# Patient Record
Sex: Female | Born: 1990 | Race: White | Hispanic: No | Marital: Married | Smoking: Never smoker
Health system: Southern US, Community
[De-identification: ages and names within clinical notes are randomized; demographics above are authoritative.]

## PROBLEM LIST (undated history)

## (undated) DIAGNOSIS — T7840XA Allergy, unspecified, initial encounter: Secondary | ICD-10-CM

## (undated) DIAGNOSIS — R519 Headache, unspecified: Secondary | ICD-10-CM

## (undated) HISTORY — DX: Allergy, unspecified, initial encounter: T78.40XA

## (undated) HISTORY — PX: TONSILLECTOMY: SUR1361

---

## 2015-03-03 HISTORY — PX: OVARIAN CYST REMOVAL: SHX89

## 2017-07-03 LAB — HM PAP SMEAR: HM Pap smear: NORMAL

## 2018-11-29 ENCOUNTER — Other Ambulatory Visit: Payer: Self-pay

## 2018-11-29 ENCOUNTER — Encounter: Payer: Self-pay | Admitting: Physical Therapy

## 2018-11-29 ENCOUNTER — Ambulatory Visit: Payer: BC Managed Care – PPO | Attending: Obstetrics and Gynecology | Admitting: Physical Therapy

## 2018-11-29 DIAGNOSIS — R278 Other lack of coordination: Secondary | ICD-10-CM | POA: Diagnosis present

## 2018-11-29 DIAGNOSIS — R102 Pelvic and perineal pain: Secondary | ICD-10-CM

## 2018-11-29 DIAGNOSIS — M6281 Muscle weakness (generalized): Secondary | ICD-10-CM | POA: Diagnosis present

## 2018-11-29 NOTE — Therapy (Signed)
[place] [facility] [address] [place], [place], [address] Phone: [phone]   Fax:  [phone]  Physical Therapy Evaluation  Patient Details  Name: Doris Sutton MRN: [id number] Date of Birth: 03-23-1991 Referring Provider (PT): Heloise OchoaMcVey, Rebecca, [place]   Encounter Date: 11/29/2018  PT End of Session - 11/29/18 1507    Visit Number  1    Number of Visits  9    Date for PT Re-Evaluation  02/07/19    PT Start Time  1504    PT Stop Time  1554    PT Time Calculation (min)  50 min    Activity Tolerance  Patient tolerated treatment well    Behavior During Therapy  [facility] for tasks assessed/performed       History reviewed. No pertinent past medical history.  Past Surgical History:  Procedure Laterality Date  . OVARIAN CYST REMOVAL Left 03/2015    There were no vitals filed for this visit.  Pelvic Floor Physical Therapy Evaluation and Assessment    [facility] - 11/29/18 1510      Assessment   Medical Diagnosis  Pelvic Pain    Referring Provider (PT)  McVey, Lurena Joinerebecca, CNM    Onset Date/Surgical Date  10/30/18    Hand Dominance  Right    Next MD Visit  11/30/2018    Prior Therapy  Yes      Precautions   Precautions  None      Restrictions   Weight Bearing Restrictions  No      Balance Screen   Has the patient fallen in the past 6 months  No       SCREENING  Red Flags: None Have you had any night sweats? Unexplained weight loss? Saddle anesthesia? Unexplained changes in bowel or bladder habits?  SUBJECTIVE  Patient reports: Patient has participated in pelvic health previously and found KT useful; she states that she had increased LBP after using the KT. Patient reports that previous PT noted increased time to relax PFM after a contraction. October 2016 had ovarian cysts removed which are now controlled with birth controllable. Patient states she has the same pain now, but no pain when pregnant. Patient does not  presently have pain with intercourse, but also states that she has intercourse infrequently. Patient presently works on her feet for 8-9 hours/shift. Pain decreases with a deep squat but gets shooting pains down the leg with that position. Patient is concerned for endometriosis.   Fitness/Wellness: Patient walks with her baby.   Obstetrical History: G1P1, vaginal delivery, grade 2 tearing  Gynecological History: Ovarian cysts (2016; 2)  Urinary History: Urgency; 100% post void; 50% pre void. Every 2 hours. Occasional sensation to go back to urinate after recent void.  Gastrointestinal History: Bowel: 3-4x/day; Type 5-6 on [place] Stool Chart  Sexual activity/pain: 1 x/week   Location of pain: L side of pelvis Current pain:  0/10  Max pain:  7/10 Least pain:  0/10 Nature of pain: dull, achey; starts with shooting pain and subsides to dull and achey  Patient Goals: "to see what there is to make it better." Patient is distracted by pain.    OBJECTIVE  Posture/Observations:  Sitting: LLE crossed over right Standing: hyperextension at thoracolumbar junction, hangs on Y ligaments, R iliac crest and PSIS elevated; leg length assessed to be ~symmetrical.   Palpation/Segmental Motion/Joint Play: Generally hypermobile throughout; concordant L sided LBP with T12 R UPA; B UPA L2-4. Concordant pelvic pain with L  CPA L5. TTP over L sacrotuberous and posterior sacro-spinous ligaments.   Range of Motion/Flexibilty:  Spine: WFL Hips: WFL   Strength Testing: MMT  RLE LLE  Hip Flexion 3+ 3  Hip Extension 4 3  Hip Abduction  4 3+  Hip Adduction  4 4  Hip ER  4 4  Hip IR  4 3+  Knee Extension 4 4  Knee Flexion 4 4  Dorsiflexion  4 4  Plantarflexion (seated) 4 4   Pelvic Floor External Exam: Breath coordination: no appreciable change in length Verbal cues: appreciable lengthening; appreciable but short duration contraction Palpation: TTP of L ischiocavernosus just superior to  ischial tuberosity Cough: paradoxical   Internal Vaginal Exam: deferred on this date Strength (PERF):  Symmetry: Palpation: Prolapse:   Gait Analysis: Patient ambulates with decreased use of BLE musculature and hyperextension at spine, hips, and knees for stability. Decreased foot clearance.  Pelvic Floor Outcome Measures: NIH- CPSI (female) 20 (Pain 10; urinary 5; QOL 5)   Objective measurements completed on examination: See above findings.   ASSESSMENT Patient is a 28 year old presenting to clinic with chief complaints of L sided pelvic pain. Upon examination, patient demonstrates deficits in posture, pain, strength, coordination as evidenced by LLE 3/5 hip extension and flexion, hyperextension at thoracolumbar jxn, and decreased ability to lengthen the PFM with verbal and tactile cues. Patient's responses on NIH-CPSI outcome measures (20/43) indicate moderate functional limitations/disability/distress. Patient's progress may be limited due to sedentary lifestyle, chronicity/recurrence of pain; however, patient's motivation and curiosity is advantageous. Patient was able to achieve basic understanding diaphragmatic breath during today's evaluation and responded positively to educational interventions. Patient will benefit from continued skilled therapeutic intervention to address deficits in posture, pain, strength, coordination in order to manage pain, increase function, and improve overall QOL.  TREATMENT  Neuromuscular Re-education: Supine diaphragmatic breathing with VCs and TCs for improved coordination  Patient educated on prognosis, POC, and provided with HEP including: bladder diary, diaphragmatic breathing. Patient articulated understanding and returned demonstration. Patient will benefit from further education in order to maximize compliance and understanding for long-term therapeutic gains.      PT Long Term Goals - 11/29/18 1635      PT LONG TERM GOAL #1   Title   Patient will indicate at least a 7 point difference on the NIH-CPSI Female form to demonstrate clinically significant improvement in pain severity for improved overall QOL.    Baseline  IE: 20/43    Time  8    Period  Weeks    Status  New    Target Date  01/24/19      PT LONG TERM GOAL #2   Title  Patient will decrease worst pain as reported on NPRS by at least 2 points to demonstrate clinically significant reduction in pain in order to restore/improve function and overall QOL.    Baseline  IE: 7/10    Time  8    Period  Weeks    Status  New    Target Date  01/24/19      PT LONG TERM GOAL #3   Title  Patient will demonstrate understanding of basic self-management/down-regulation of the nervous system for persistent pain condition and stress as evidenced by diaphragmatic breathing without cueing, body scan/progressive relaxation meditation, and improved sleep hygiene in order to transition to independent management of patient's chief complaint: recurrent pelvic pain.    Baseline  IE: diaphragmatic breath taught    Time  8  Period  Weeks    Status  New    Target Date  01/24/19      PT LONG TERM GOAL #4   Title  Patient will report less than 50% incidents of urge urinary incontinence post-voiding over the course of 3 weeks while in order to demonstrate improved PFM coordination, strength, and function for improved overall QOL.    Baseline  IE: 100%    Time  8    Period  Weeks    Status  New    Target Date  01/24/19         Plan - 11/29/18 1508    Clinical Impression Statement  Patient is a 28 year old presenting to clinic with chief complaints of L sided pelvic pain. Upon examination, patient demonstrates deficits in posture, pain, strength, coordination as evidenced by LLE 3/5 hip extension and flexion, hyperextension at thoracolumbar jxn, and decreased ability to lengthen the PFM with verbal and tactile cues. Patient's responses on NIH-CPSI outcome measures (20/43) indicate  moderate functional limitations/disability/distress. Patient's progress may be limited due to sedentary lifestyle, chronicity/recurrence of pain; however, patient's motivation and curiosity is advantageous. Patient was able to achieve basic understanding diaphragmatic breath during today's evaluation and responded positively to educational interventions. Patient will benefit from continued skilled therapeutic intervention to address deficits in posture, pain, strength, coordination in order to manage pain, increase function, and improve overall QOL.    Personal Factors and Comorbidities  Age;Sex;Social Background;Behavior Pattern;Fitness;Time since onset of injury/illness/exacerbation;Past/Current Experience    Examination-Activity Limitations  Squat;Lift;Locomotion Level;Continence;Stand;Stairs;Carry;Caring for Others;Bend    Examination-Participation Restrictions  Laundry;Cleaning;Yard Work;Interpersonal Relationship;Meal Prep    Stability/Clinical Decision Making  Evolving/Moderate complexity    Clinical Decision Making  Moderate    Rehab Potential  Good    PT Frequency  1x / week    PT Duration  8 weeks    PT Treatment/Interventions  ADLs/Self Care Home Management;Aquatic Therapy;Cryotherapy;Electrical Stimulation;Moist Heat;Biofeedback;Gait training;Stair training;Functional mobility training;Therapeutic activities;Therapeutic exercise;Balance training;Patient/family education;Neuromuscular re-education;Manual techniques;Dry needling;Joint Manipulations;Spinal Manipulations;Taping;Scar mobilization    PT Next Visit Plan  Mobilizations with movement; PFM lengthening coordination    PT Home Exercise Plan  diaphragmatic breathing, bladder diary    Consulted and Agree with Plan of Care  Patient       Patient will benefit from skilled therapeutic intervention in order to improve the following deficits and impairments:  Abnormal gait, Difficulty walking, Increased muscle spasms, Improper body  mechanics, Decreased coordination, Decreased strength, Postural dysfunction, Pain, Increased fascial restricitons, Decreased balance, Decreased endurance, Decreased mobility  Visit Diagnosis: 1. Pelvic pain   2. Muscle weakness (generalized)   3. Other lack of coordination        Problem List There are no active problems to display for this patient.  Myles Gip PT, DPT [phone] 11/29/2018, 4:39 PM  [place] [facility] [address] [place], [place], [address] Phone: [phone]   Fax:  [phone]  Name: Doris Sutton MRN: [id number] Date of Birth: May 25, 1991

## 2018-12-07 ENCOUNTER — Encounter: Payer: Self-pay | Admitting: Physical Therapy

## 2018-12-07 ENCOUNTER — Ambulatory Visit: Payer: BC Managed Care – PPO | Attending: Obstetrics and Gynecology | Admitting: Physical Therapy

## 2018-12-07 ENCOUNTER — Other Ambulatory Visit: Payer: Self-pay

## 2018-12-07 DIAGNOSIS — M6281 Muscle weakness (generalized): Secondary | ICD-10-CM | POA: Insufficient documentation

## 2018-12-07 DIAGNOSIS — R102 Pelvic and perineal pain: Secondary | ICD-10-CM

## 2018-12-07 DIAGNOSIS — R278 Other lack of coordination: Secondary | ICD-10-CM | POA: Diagnosis present

## 2018-12-07 NOTE — Therapy (Signed)
[place] [facility] [address] [place], [place], [address] Phone: [phone]   Fax:  [phone]  Physical Therapy Treatment  Patient Details  Name: Doris Sutton MRN: [id number] Date of Birth: Sep 09, 1990 Referring Provider (PT): Heloise OchoaMcVey, Rebecca, [place]   Encounter Date: 12/07/2018  PT End of Session - 12/07/18 1555    Visit Number  2    Number of Visits  9    Date for PT Re-Evaluation  02/07/19    PT Start Time  1543    PT Stop Time  1636    PT Time Calculation (min)  53 min    Activity Tolerance  Patient tolerated treatment well    Behavior During Therapy  [facility] for tasks assessed/performed       History reviewed. No pertinent past medical history.  Past Surgical History:  Procedure Laterality Date  . OVARIAN CYST REMOVAL Left 03/2015    There were no vitals filed for this visit.  Subjective Assessment - 12/07/18 1545    Subjective  Patient notes that she has pain today and notes that it occasionally shoots to the inner thigh. Patient supplied bladder diary. Patient notes that she did a rotational movment at the spine and had a back spasm which was exacerabted by breathing.    Currently in Pain?  Yes    Pain Score  7     Pain Location  Pelvis    Pain Orientation  Left    Pain Descriptors / Indicators  Sharp;Shooting       TREATMENT  Neuromuscular Re-education: Supine Diaphragmatic breathing x20 PFM lengthening with anterior pelvic tilt x20 Adductor lengthening stretches in various positions: sitting, supine, standing. Patient educated on prolonged supported positioning within tolerable ranges. Education on bladder function and urge suppression techniques with handout.    Patient educated throughout session on appropriate technique and form using multi-modal cueing, HEP, and activity modification. Patient articulated understanding and returned demonstration.  Patient Response to interventions: Denies any increase in pain  intensity.  ASSESSMENT Patient presents to clinic with excellent motivation to participate in therapy. Patient demonstrates deficits in posture, pain, strength, coordination as evidenced by slumped posture and posterior pelvic tilt in sitting and standing rest postures. Patient able to achieve understanding of urge suppression during today's session and responded positively to educational interventions. Patient will benefit from continued skilled therapeutic intervention to address remaining deficits in posture, pain, strength, coordination in order to increase function and improve overall QOL.  PT Long Term Goals - 11/29/18 1635      PT LONG TERM GOAL #1   Title  Patient will indicate at least a 7 point difference on the NIH-CPSI Female form to demonstrate clinically significant improvement in pain severity for improved overall QOL.    Baseline  IE: 20/43    Time  8    Period  Weeks    Status  New    Target Date  01/24/19      PT LONG TERM GOAL #2   Title  Patient will decrease worst pain as reported on NPRS by at least 2 points to demonstrate clinically significant reduction in pain in order to restore/improve function and overall QOL.    Baseline  IE: 7/10    Time  8    Period  Weeks    Status  New    Target Date  01/24/19      PT LONG TERM GOAL #3   Title  Patient will demonstrate understanding of basic  self-management/down-regulation of the nervous system for persistent pain condition and stress as evidenced by diaphragmatic breathing without cueing, body scan/progressive relaxation meditation, and improved sleep hygiene in order to transition to independent management of patient's chief complaint: recurrent pelvic pain.    Baseline  IE: diaphragmatic breath taught    Time  8    Period  Weeks    Status  New    Target Date  01/24/19      PT LONG TERM GOAL #4   Title  Patient will report less than 50% incidents of urge urinary incontinence post-voiding over the course of 3 weeks  while in order to demonstrate improved PFM coordination, strength, and function for improved overall QOL.    Baseline  IE: 100%    Time  8    Period  Weeks    Status  New    Target Date  01/24/19            Plan - 12/08/18 1054    Clinical Impression Statement  Patient presents to clinic with excellent motivation to participate in therapy. Patient demonstrates deficits in posture, pain, strength, coordination as evidenced by slumped posture and posterior pelvic tilt in sitting and standing rest postures. Patient able to achieve understanding of urge suppression during today's session and responded positively to educational interventions. Patient will benefit from continued skilled therapeutic intervention to address remaining deficits in posture, pain, strength, coordination in order to increase function and improve overall QOL.    Personal Factors and Comorbidities  Age;Sex;Social Background;Behavior Pattern;Fitness;Time since onset of injury/illness/exacerbation;Past/Current Experience    Examination-Activity Limitations  Squat;Lift;Locomotion Level;Continence;Stand;Stairs;Carry;Caring for Others;Bend    Examination-Participation Restrictions  Laundry;Cleaning;Yard Work;Interpersonal Relationship;Meal Prep    Stability/Clinical Decision Making  Evolving/Moderate complexity    Rehab Potential  Good    PT Frequency  1x / week    PT Duration  8 weeks    PT Treatment/Interventions  ADLs/Self Care Home Management;Aquatic Therapy;Cryotherapy;Electrical Stimulation;Moist Heat;Biofeedback;Gait training;Stair training;Functional mobility training;Therapeutic activities;Therapeutic exercise;Balance training;Patient/family education;Neuromuscular re-education;Manual techniques;Dry needling;Joint Manipulations;Spinal Manipulations;Taping;Scar mobilization    PT Next Visit Plan  Mobilizations with movement; PFM lengthening coordination    PT Home Exercise Plan  diaphragmatic breathing, bladder diary     Consulted and Agree with Plan of Care  Patient       Patient will benefit from skilled therapeutic intervention in order to improve the following deficits and impairments:  Abnormal gait, Difficulty walking, Increased muscle spasms, Improper body mechanics, Decreased coordination, Decreased strength, Postural dysfunction, Pain, Increased fascial restricitons, Decreased balance, Decreased endurance, Decreased mobility  Visit Diagnosis: 1. Pelvic pain   2. Muscle weakness (generalized)   3. Other lack of coordination        Problem List There are no active problems to display for this patient.  Myles Gip PT, DPT [phone] 12/08/2018, 10:55 AM  [place] [facility] [address] [place], [place], [address] Phone: [phone]   Fax:  [phone]  Name: Doris Sutton MRN: [id number] Date of Birth: 05/12/1991

## 2018-12-14 ENCOUNTER — Ambulatory Visit: Payer: BC Managed Care – PPO | Admitting: Physical Therapy

## 2018-12-14 ENCOUNTER — Other Ambulatory Visit: Payer: Self-pay

## 2018-12-14 ENCOUNTER — Encounter: Payer: Self-pay | Admitting: Physical Therapy

## 2018-12-14 DIAGNOSIS — R102 Pelvic and perineal pain: Secondary | ICD-10-CM | POA: Diagnosis not present

## 2018-12-14 DIAGNOSIS — M6281 Muscle weakness (generalized): Secondary | ICD-10-CM

## 2018-12-14 DIAGNOSIS — R278 Other lack of coordination: Secondary | ICD-10-CM

## 2018-12-14 NOTE — Therapy (Signed)
[place] [facility] [address] [place], [place], [address] Phone: [phone]   Fax:  [phone]  Physical Therapy Treatment  Patient Details  Name: Doris Sutton MRN: [id number] Date of Birth: July 27, 1990 Referring Provider (PT): Hassan Buckler, [place]   Encounter Date: 12/14/2018  PT End of Session - 12/14/18 1557    Visit Number  3    Number of Visits  9    Date for PT Re-Evaluation  02/07/19    PT Start Time  4332    PT Stop Time  1646    PT Time Calculation (min)  54 min    Activity Tolerance  Patient tolerated treatment well    Behavior During Therapy  [facility] for tasks assessed/performed       History reviewed. No pertinent past medical history.  Past Surgical History:  Procedure Laterality Date  . OVARIAN CYST REMOVAL Left 03/2015    There were no vitals filed for this visit.  Subjective Assessment - 12/14/18 1555    Subjective  Patient states that she is sore from doing her stretches and notes that the standing ones have been the easiest ones to fit into her routine. She is excited to go on vacation next week as her work schedule has become more and more demanding.    Currently in Pain?  Yes    Pain Score  4     Pain Orientation  Left    Pain Descriptors / Indicators  Dull    Pain Type  Chronic pain      TREATMENT  Neuromuscular Re-education: Global CARs routine/assessment:  Cervical (increased scapular and GH elevation/abduction during rotation, with decreased hip control)  Scapular ([place] and thoracic compensatory mechanisms)  GH (significant thoracic, lumbar, and pelvic compensatory mechanisms)  Spine (inability to dissociate from hips with increased discomfort noted in hips)  Hips (inability to dissociate from spine) Hip CARs in standing for improved dissociation, tissue length, and decreased spasm. Patient requiring max VCs and TCs. MVC 40%. Passive stretch education for improved posture and resting tissue  length.  Patient educated throughout session on appropriate technique and form using multi-modal cueing, HEP, and activity modification. Patient articulated understanding and returned demonstration.   ASSESSMENT Patient presents to clinic with excellent motivation to participate in therapy. Patient demonstrates deficits in posture, pain, strength, coordination as evidenced by inability to move joints independently in absence of compensatory movement at other joints. Patient able to achieve understanding of level 1 controlled articular rotations for improved proprioception and body awareness during today's session and responded positively to educational interventions. Patient will benefit from continued skilled therapeutic intervention to address remaining deficits in posture, pain, strength, coordination in order to increase function and improve overall QOL.   PT Long Term Goals - 11/29/18 1635      PT LONG TERM GOAL #1   Title  Patient will indicate at least a 7 point difference on the NIH-CPSI Female form to demonstrate clinically significant improvement in pain severity for improved overall QOL.    Baseline  IE: 20/43    Time  8    Period  Weeks    Status  New    Target Date  01/24/19      PT LONG TERM GOAL #2   Title  Patient will decrease worst pain as reported on NPRS by at least 2 points to demonstrate clinically significant reduction in pain in order to restore/improve function and overall QOL.    Baseline  IE: 7/10  Time  8    Period  Weeks    Status  New    Target Date  01/24/19      PT LONG TERM GOAL #3   Title  Patient will demonstrate understanding of basic self-management/down-regulation of the nervous system for persistent pain condition and stress as evidenced by diaphragmatic breathing without cueing, body scan/progressive relaxation meditation, and improved sleep hygiene in order to transition to independent management of patient's chief complaint: recurrent pelvic  pain.    Baseline  IE: diaphragmatic breath taught    Time  8    Period  Weeks    Status  New    Target Date  01/24/19      PT LONG TERM GOAL #4   Title  Patient will report less than 50% incidents of urge urinary incontinence post-voiding over the course of 3 weeks while in order to demonstrate improved PFM coordination, strength, and function for improved overall QOL.    Baseline  IE: 100%    Time  8    Period  Weeks    Status  New    Target Date  01/24/19            Plan - 12/14/18 1653    Clinical Impression Statement  Patient presents to clinic with excellent motivation to participate in therapy. Patient demonstrates deficits in posture, pain, strength, coordination as evidenced by inability to move joints independently in absence of compensatory movement at other joints. Patient able to achieve understanding of level 1 controlled articular rotations for improved proprioception and body awareness during today's session and responded positively to educational interventions. Patient will benefit from continued skilled therapeutic intervention to address remaining deficits in posture, pain, strength, coordination in order to increase function and improve overall QOL.    Personal Factors and Comorbidities  Age;Sex;Social Background;Behavior Pattern;Fitness;Time since onset of injury/illness/exacerbation;Past/Current Experience    Examination-Activity Limitations  Squat;Lift;Locomotion Level;Continence;Stand;Stairs;Carry;Caring for Others;Bend    Examination-Participation Restrictions  Laundry;Cleaning;Yard Work;Interpersonal Relationship;Meal Prep    Stability/Clinical Decision Making  Evolving/Moderate complexity    Rehab Potential  Good    PT Frequency  1x / week    PT Duration  8 weeks    PT Treatment/Interventions  ADLs/Self Care Home Management;Aquatic Therapy;Cryotherapy;Electrical Stimulation;Moist Heat;Biofeedback;Gait training;Stair training;Functional mobility  training;Therapeutic activities;Therapeutic exercise;Balance training;Patient/family education;Neuromuscular re-education;Manual techniques;Dry needling;Joint Manipulations;Spinal Manipulations;Taping;Scar mobilization    PT Next Visit Plan  Mobilizations with movement; PFM lengthening coordination    PT Home Exercise Plan  diaphragmatic breathing, bladder diary    Consulted and Agree with Plan of Care  Patient       Patient will benefit from skilled therapeutic intervention in order to improve the following deficits and impairments:  Abnormal gait, Difficulty walking, Increased muscle spasms, Improper body mechanics, Decreased coordination, Decreased strength, Postural dysfunction, Pain, Increased fascial restricitons, Decreased balance, Decreased endurance, Decreased mobility  Visit Diagnosis: 1. Pelvic pain   2. Muscle weakness (generalized)   3. Other lack of coordination        Problem List There are no active problems to display for this patient.  Sheria LangKatlin Xareni Kelch PT, DPT [phone] 12/15/2018, 12:16 PM  [facility] [address] [place], [place], [address] Phone: [phone]   Fax:  [phone]  Name: Doris MeekerKelly Sutton MRN: [id number] Date of Birth: 31-Mar-1991

## 2018-12-21 ENCOUNTER — Ambulatory Visit: Payer: BC Managed Care – PPO | Admitting: Physical Therapy

## 2018-12-28 ENCOUNTER — Encounter: Payer: Self-pay | Admitting: Physical Therapy

## 2018-12-28 ENCOUNTER — Other Ambulatory Visit: Payer: Self-pay

## 2018-12-28 ENCOUNTER — Ambulatory Visit: Payer: BC Managed Care – PPO | Admitting: Physical Therapy

## 2018-12-28 DIAGNOSIS — R102 Pelvic and perineal pain: Secondary | ICD-10-CM | POA: Diagnosis not present

## 2018-12-28 DIAGNOSIS — R278 Other lack of coordination: Secondary | ICD-10-CM

## 2018-12-28 DIAGNOSIS — M6281 Muscle weakness (generalized): Secondary | ICD-10-CM

## 2018-12-29 NOTE — Therapy (Signed)
[place] [facility] [address] [place], [place], [address] Phone: [phone]   Fax:  [phone]  Physical Therapy Treatment  Patient Details  Name: Doris Sutton MRN: [id number] Date of Birth: [birth year] Referring Provider (PT): Hassan Buckler, [place]   Encounter Date: 12/28/2018  PT End of Session - 12/28/18 1600    Visit Number  4    Number of Visits  9    Date for PT Re-Evaluation  02/07/19    PT Start Time  1550    PT Stop Time  1630    PT Time Calculation (min)  40 min    Activity Tolerance  Patient tolerated treatment well    Behavior During Therapy  [facility] for tasks assessed/performed       History reviewed. No pertinent past medical history.  Past Surgical History:  Procedure Laterality Date  . OVARIAN CYST REMOVAL Left 03/2015    There were no vitals filed for this visit.  Subjective Assessment - 12/28/18 1554    Subjective  Patient states that she had a nice vacation. She reports that she did her exercises while she was on vacation. Patient notes that she rode jet skis and did note some increased soreness in the legs. Patient notes that this is the first day she has not had much soreness. Patient also reports starting her first period since giving birth; and has had some cramping.    Currently in Pain?  Yes    Pain Score  4     Pain Location  Pelvis       TREATMENT  Pre-treatment assessment: L hip abduction 23 degrees, R hip abduction 20 degrees, ASIS level  Manual Therapy: STM and TPR performed to R adductor and hamstring group to allow for decreased tension and pain and improved posture and function. Distal portions responding well to release; moving proximally more reactive spasm  Neuromuscular Re-education: PNF contract-relax adductor lengthening in hooklying, multiple bouts Passive range hold R hip abduction, bent knee with gentle isometrics for decreased muscle spasm Reviewed HEP.  Patient educated throughout  session on appropriate technique and form using multi-modal cueing, HEP, and activity modification. Patient articulated understanding and returned demonstration.  Patient Response to interventions: Patient reports decreased R hip stiffness  ASSESSMENT Patient presents to clinic with excellent motivation to participate in therapy. Patient demonstrates deficits in posture, pain, strength, coordination as evidenced by increased trigger points and hypertonicity in R adductor group. Patient able to achieve decreased muscle spasm in distal R adductor group during today's session and responded positively to educational interventions. Patient will benefit from continued skilled therapeutic intervention to address remaining deficits in posture, pain, strength, coordination in order to increase function and improve overall QOL.    PT Long Term Goals - 11/29/18 1635      PT LONG TERM GOAL #1   Title  Patient will indicate at least a 7 point difference on the NIH-CPSI Female form to demonstrate clinically significant improvement in pain severity for improved overall QOL.    Baseline  IE: 20/43    Time  8    Period  Weeks    Status  New    Target Date  01/24/19      PT LONG TERM GOAL #2   Title  Patient will decrease worst pain as reported on NPRS by at least 2 points to demonstrate clinically significant reduction in pain in order to restore/improve function and overall QOL.    Baseline  IE: 7/10  Time  8    Period  Weeks    Status  New    Target Date  01/24/19      PT LONG TERM GOAL #3   Title  Patient will demonstrate understanding of basic self-management/down-regulation of the nervous system for persistent pain condition and stress as evidenced by diaphragmatic breathing without cueing, body scan/progressive relaxation meditation, and improved sleep hygiene in order to transition to independent management of patient's chief complaint: recurrent pelvic pain.    Baseline  IE: diaphragmatic  breath taught    Time  8    Period  Weeks    Status  New    Target Date  01/24/19      PT LONG TERM GOAL #4   Title  Patient will report less than 50% incidents of urge urinary incontinence post-voiding over the course of 3 weeks while in order to demonstrate improved PFM coordination, strength, and function for improved overall QOL.    Baseline  IE: 100%    Time  8    Period  Weeks    Status  New    Target Date  01/24/19            Plan - 12/28/18 1601    Clinical Impression Statement  Patient presents to clinic with excellent motivation to participate in therapy. Patient demonstrates deficits in posture, pain, strength, coordination as evidenced by increased trigger points and hypertonicity in R adductor group. Patient able to achieve decreased muscle spasm in distal R adductor group during today's session and responded positively to educational interventions. Patient will benefit from continued skilled therapeutic intervention to address remaining deficits in posture, pain, strength, coordination in order to increase function and improve overall QOL.    Personal Factors and Comorbidities  Age;Sex;Social Background;Behavior Pattern;Fitness;Time since onset of injury/illness/exacerbation;Past/Current Experience    Examination-Activity Limitations  Squat;Lift;Locomotion Level;Continence;Stand;Stairs;Carry;Caring for Others;Bend    Examination-Participation Restrictions  Laundry;Cleaning;Yard Work;Interpersonal Relationship;Meal Prep    Stability/Clinical Decision Making  Evolving/Moderate complexity    Rehab Potential  Good    PT Frequency  1x / week    PT Duration  8 weeks    PT Treatment/Interventions  ADLs/Self Care Home Management;Aquatic Therapy;Cryotherapy;Electrical Stimulation;Moist Heat;Biofeedback;Gait training;Stair training;Functional mobility training;Therapeutic activities;Therapeutic exercise;Balance training;Patient/family education;Neuromuscular re-education;Manual  techniques;Dry needling;Joint Manipulations;Spinal Manipulations;Taping;Scar mobilization    PT Next Visit Plan  Mobilizations with movement; PFM lengthening coordination    PT Home Exercise Plan  diaphragmatic breathing, bladder diary    Consulted and Agree with Plan of Care  Patient       Patient will benefit from skilled therapeutic intervention in order to improve the following deficits and impairments:  Abnormal gait, Difficulty walking, Increased muscle spasms, Improper body mechanics, Decreased coordination, Decreased strength, Postural dysfunction, Pain, Increased fascial restricitons, Decreased balance, Decreased endurance, Decreased mobility  Visit Diagnosis: 1. Pelvic pain   2. Muscle weakness (generalized)   3. Other lack of coordination        Problem List There are no active problems to display for this patient.  Sheria LangKatlin Itzamar Traynor PT, DPT [phone] 12/29/2018, 8:12 AM  [place] [facility] [address] [place], [place], [address] Phone: [phone]   Fax:  [phone]  Name: Doris MeekerKelly Sutton MRN: [id number] Date of Birth: April 09, 1991

## 2019-01-04 ENCOUNTER — Other Ambulatory Visit: Payer: Self-pay

## 2019-01-04 ENCOUNTER — Ambulatory Visit: Payer: BC Managed Care – PPO | Attending: Obstetrics and Gynecology | Admitting: Physical Therapy

## 2019-01-04 ENCOUNTER — Encounter: Payer: Self-pay | Admitting: Physical Therapy

## 2019-01-04 DIAGNOSIS — M6281 Muscle weakness (generalized): Secondary | ICD-10-CM

## 2019-01-04 DIAGNOSIS — R102 Pelvic and perineal pain unspecified side: Secondary | ICD-10-CM

## 2019-01-04 DIAGNOSIS — R278 Other lack of coordination: Secondary | ICD-10-CM

## 2019-01-04 NOTE — Therapy (Signed)
[place] [facility] [address] [place], [place], [address] Phone: [phone]   Fax:  [phone]  Physical Therapy Treatment  Patient Details  Name: Doris Sutton MRN: [id number] Date of Birth: [birth year] Referring Provider (PT): Hassan Buckler, [place]   Encounter Date: 01/04/2019  PT End of Session - 01/04/19 1639    Visit Number  5    Number of Visits  9    Date for PT Re-Evaluation  02/07/19    PT Start Time  6962    PT Stop Time  1637    PT Time Calculation (min)  47 min    Activity Tolerance  Patient tolerated treatment well    Behavior During Therapy  [facility] for tasks assessed/performed       History reviewed. No pertinent past medical history.  Past Surgical History:  Procedure Laterality Date  . OVARIAN CYST REMOVAL Left 03/2015    There were no vitals filed for this visit.  Subjective Assessment - 01/04/19 1551    Subjective  Patient reports that work has been very busy with school returning. And she does not report having much time for herself to manage stress at work or at home. Patient notes that she has felt increased tension in her adductors and PFM as a result of the increased stress.       TREATMENT  Pre-treatment assessment:  Manual Therapy: STM and TPR performed to B adductor groups to allow for decreased tension and pain and improved posture and function   Neuromuscular Re-education: Patient education on sleep hygiene and stress management techniques using motivational interviewing techniques. Patient mildly resistant to changes in behaviors, but willing to consider.  Reviewed/modified current HEP to decrease stress placed on adductor muscle group to limit increased spasm.    Patient educated throughout session on appropriate technique and form using multi-modal cueing, HEP, and activity modification. Patient articulated understanding and returned demonstration.  Patient Response to interventions: Patient  reports decreased tension on L, mildly improved on R.   ASSESSMENT Patient presents to clinic with excellent motivation to participate in therapy. Patient demonstrates deficits in posture, pain, strength, coordination as evidenced by increased trigger points and hypertonicity in R adductor group. Patient able to achieve decreased muscle spasm in B distal adductor group during today's session and responded positively to educational interventions. Patient will benefit from continued skilled therapeutic intervention to address remaining deficits in posture, pain, strength, coordination in order to increase function and improve overall QOL.        PT Long Term Goals - 11/29/18 1635      PT LONG TERM GOAL #1   Title  Patient will indicate at least a 7 point difference on the NIH-CPSI Female form to demonstrate clinically significant improvement in pain severity for improved overall QOL.    Baseline  IE: 20/43    Time  8    Period  Weeks    Status  New    Target Date  01/24/19      PT LONG TERM GOAL #2   Title  Patient will decrease worst pain as reported on NPRS by at least 2 points to demonstrate clinically significant reduction in pain in order to restore/improve function and overall QOL.    Baseline  IE: 7/10    Time  8    Period  Weeks    Status  New    Target Date  01/24/19      PT LONG TERM GOAL #3  Title  Patient will demonstrate understanding of basic self-management/down-regulation of the nervous system for persistent pain condition and stress as evidenced by diaphragmatic breathing without cueing, body scan/progressive relaxation meditation, and improved sleep hygiene in order to transition to independent management of patient's chief complaint: recurrent pelvic pain.    Baseline  IE: diaphragmatic breath taught    Time  8    Period  Weeks    Status  New    Target Date  01/24/19      PT LONG TERM GOAL #4   Title  Patient will report less than 50% incidents of urge urinary  incontinence post-voiding over the course of 3 weeks while in order to demonstrate improved PFM coordination, strength, and function for improved overall QOL.    Baseline  IE: 100%    Time  8    Period  Weeks    Status  New    Target Date  01/24/19            Plan - 01/04/19 1639    Clinical Impression Statement  Patient presents to clinic with excellent motivation to participate in therapy. Patient demonstrates deficits in posture, pain, strength, coordination as evidenced by increased trigger points and hypertonicity in R adductor group. Patient able to achieve decreased muscle spasm in B distal adductor group during today's session and responded positively to educational interventions. Patient will benefit from continued skilled therapeutic intervention to address remaining deficits in posture, pain, strength, coordination in order to increase function and improve overall QOL.    Personal Factors and Comorbidities  Age;Sex;Social Background;Behavior Pattern;Fitness;Time since onset of injury/illness/exacerbation;Past/Current Experience    Examination-Activity Limitations  Squat;Lift;Locomotion Level;Continence;Stand;Stairs;Carry;Caring for Others;Bend    Examination-Participation Restrictions  Laundry;Cleaning;Yard Work;Interpersonal Relationship;Meal Prep    Stability/Clinical Decision Making  Evolving/Moderate complexity    Rehab Potential  Good    PT Frequency  1x / week    PT Duration  8 weeks    PT Treatment/Interventions  ADLs/Self Care Home Management;Aquatic Therapy;Cryotherapy;Electrical Stimulation;Moist Heat;Biofeedback;Gait training;Stair training;Functional mobility training;Therapeutic activities;Therapeutic exercise;Balance training;Patient/family education;Neuromuscular re-education;Manual techniques;Dry needling;Joint Manipulations;Spinal Manipulations;Taping;Scar mobilization    PT Next Visit Plan  Mobilizations with movement; PFM lengthening coordination    PT Home  Exercise Plan  diaphragmatic breathing, bladder diary    Consulted and Agree with Plan of Care  Patient       Patient will benefit from skilled therapeutic intervention in order to improve the following deficits and impairments:  Abnormal gait, Difficulty walking, Increased muscle spasms, Improper body mechanics, Decreased coordination, Decreased strength, Postural dysfunction, Pain, Increased fascial restricitons, Decreased balance, Decreased endurance, Decreased mobility  Visit Diagnosis: 1. Pelvic pain   2. Muscle weakness (generalized)   3. Other lack of coordination        Problem List There are no active problems to display for this patient.  Sheria LangKatlin Gia Lusher PT, DPT [phone]#[id number] 01/04/2019, 4:44 PM  [place] [facility] [address] [place], [place], [address] Phone: [phone]   Fax:  [phone]  Name: [name] MRN: [id number] Date of Birth: [birth year]

## 2019-01-11 ENCOUNTER — Ambulatory Visit: Payer: BC Managed Care – PPO | Admitting: Physical Therapy

## 2019-01-11 ENCOUNTER — Encounter: Payer: Self-pay | Admitting: Physical Therapy

## 2019-01-11 ENCOUNTER — Other Ambulatory Visit: Payer: Self-pay

## 2019-01-11 DIAGNOSIS — R102 Pelvic and perineal pain unspecified side: Secondary | ICD-10-CM

## 2019-01-11 DIAGNOSIS — R278 Other lack of coordination: Secondary | ICD-10-CM

## 2019-01-11 DIAGNOSIS — M6281 Muscle weakness (generalized): Secondary | ICD-10-CM

## 2019-01-11 NOTE — Therapy (Signed)
[facility] [address] [place], [place], [address] Phone: [phone]   Fax:  [phone]  Physical Therapy Treatment  Patient Details  Name: Doris MeekerKelly Sutton MRN: [id number] Date of Birth: 1990/07/09 Referring Provider (PT): Heloise OchoaMcVey, Rebecca, [place]   Encounter Date: 01/11/2019  PT End of Session - 01/11/19 1558    Visit Number  6    Number of Visits  9    Date for PT Re-Evaluation  02/07/19    PT Start Time  1550    PT Stop Time  1645    PT Time Calculation (min)  55 min    Activity Tolerance  Patient tolerated treatment well    Behavior During Therapy  [facility]       History reviewed. No pertinent past medical history.  Past Surgical History:  Procedure Laterality Date  . OVARIAN CYST REMOVAL Left 03/2015    There were no vitals filed for this visit.  Subjective Assessment - 01/11/19 1552    Subjective  Patient reports that she has been in a lot of pain today. Patient reports that she has had some bleeding which does not line up to her menstruation because she had just finished her cycle the day prior.    Currently in Pain?  Yes    Pain Score  7     Pain Location  Groin    Pain Orientation  Left    Pain Descriptors / Indicators  Burning;Azalee CourseSharp       [facility] [address] [place], [place], [address] Phone: [phone]   Fax:  [phone]  Physical Therapy Treatment  Patient Details  Name: Doris MeekerKelly Sutton MRN: [id number] Date of Birth: 1990/07/09 Referring Provider (PT): Heloise OchoaMcVey, Rebecca, [place]   Encounter Date: 01/11/2019  PT End of Session - 01/11/19 1558    Visit Number  6    Number of Visits  9    Date for PT Re-Evaluation  02/07/19    PT Start Time  1550    PT Stop Time  1645    PT Time Calculation (min)  55 min    Activity Tolerance  Patient tolerated treatment well    Behavior During Therapy  [facility]        History reviewed. No pertinent past medical history.  Past Surgical History:  Procedure Laterality Date  . OVARIAN CYST REMOVAL Left 03/2015    There were no vitals filed for this visit.  Subjective Assessment - 01/11/19 1552    Subjective  Patient reports that she has been in a lot of pain today. Patient reports that she has had some bleeding which does not line up to her menstruation because she had just finished her cycle the day prior.    Currently in Pain?  Yes    Pain Score  7     Pain Location  Groin    Pain Orientation  Left    Pain Descriptors / Indicators  Burning;Sharp      TREATMENT  Manual Therapy: STM and TPR performed to B adductor groups, B thoracic and lumbar parapsinals to allow for decreased tension and pain and improved posture and function CPA/UPA thoracic and lumbar spine, grade II/III for decreased tension and spasm in the region  Neuromuscular Re-education: Modified cobra/swan for improved thoracic extension, x5 with coordinated breath and  Reviewed HEP and adductor stretches; discussed pain modulation techniques.   Patient educated throughout session on appropriate  technique and form using multi-modal cueing, HEP, and activity modification. Patient articulated understanding and returned demonstration.  Patient Response to interventions: Patient reports decreased back tension and mildly decreased L adductor pain, no R adductor pain  ASSESSMENT Patient presents to clinic with excellent motivation to participate in therapy. Patient demonstrates deficits in posture, pain, strength, coordination as evidenced by increased trigger points and hypertonicity in R adductor group. Patient able to achieve decreased muscle spasm in thoracolumbar region during today's session and responded positively to manual interventions. Patient will benefit from continued skilled therapeutic intervention to address remaining deficits in posture, pain, strength, coordination in  order to increase function and improve overall QOL.    PT Long Term Goals - 11/29/18 1635      PT LONG TERM GOAL #1   Title  Patient will indicate at least a 7 point difference on the NIH-CPSI Female form to demonstrate clinically significant improvement in pain severity for improved overall QOL.    Baseline  IE: 20/43    Time  8    Period  Weeks    Status  New    Target Date  01/24/19      PT LONG TERM GOAL #2   Title  Patient will decrease worst pain as reported on NPRS by at least 2 points to demonstrate clinically significant reduction in pain in order to restore/improve function and overall QOL.    Baseline  IE: 7/10    Time  8    Period  Weeks    Status  New    Target Date  01/24/19      PT LONG TERM GOAL #3   Title  Patient will demonstrate understanding of basic self-management/down-regulation of the nervous system for persistent pain condition and stress as evidenced by diaphragmatic breathing without cueing, body scan/progressive relaxation meditation, and improved sleep hygiene in order to transition to independent management of patient's chief complaint: recurrent pelvic pain.    Baseline  IE: diaphragmatic breath taught    Time  8    Period  Weeks    Status  New    Target Date  01/24/19      PT LONG TERM GOAL #4   Title  Patient will report less than 50% incidents of urge urinary incontinence post-voiding over the course of 3 weeks while in order to demonstrate improved PFM coordination, strength, and function for improved overall QOL.    Baseline  IE: 100%    Time  8    Period  Weeks    Status  New    Target Date  01/24/19            Plan - 01/11/19 1558    Clinical Impression Statement  Patient presents to clinic with excellent motivation to participate in therapy. Patient demonstrates deficits in posture, pain, strength, coordination as evidenced by increased trigger points and hypertonicity in R adductor group. Patient able to achieve decreased muscle  spasm in thoracolumbar region during today's session and responded positively to manual interventions. Patient will benefit from continued skilled therapeutic intervention to address remaining deficits in posture, pain, strength, coordination in order to increase function and improve overall QOL.    Personal Factors and Comorbidities  Age;Sex;Social Background;Behavior Pattern;Fitness;Time since onset of injury/illness/exacerbation;Past/Current Experience    Examination-Activity Limitations  Squat;Lift;Locomotion Level;Continence;Stand;Stairs;Carry;Caring for Others;Bend    Examination-Participation Restrictions  Laundry;Cleaning;Yard Work;Interpersonal Relationship;Meal Prep    Stability/Clinical Decision Making  Evolving/Moderate complexity    Rehab Potential  Good    PT  Frequency  1x / week    PT Duration  8 weeks    PT Treatment/Interventions  ADLs/Self Care Home Management;Aquatic Therapy;Cryotherapy;Electrical Stimulation;Moist Heat;Biofeedback;Gait training;Stair training;Functional mobility training;Therapeutic activities;Therapeutic exercise;Balance training;Patient/family education;Neuromuscular re-education;Manual techniques;Dry needling;Joint Manipulations;Spinal Manipulations;Taping;Scar mobilization    PT Next Visit Plan  Mobilizations with movement; PFM lengthening coordination    PT Home Exercise Plan  diaphragmatic breathing, bladder diary    Consulted and Agree with Plan of Care  Patient       Patient will benefit from skilled therapeutic intervention in order to improve the following deficits and impairments:  Abnormal gait, Difficulty walking, Increased muscle spasms, Improper body mechanics, Decreased coordination, Decreased strength, Postural dysfunction, Pain, Increased fascial restricitons, Decreased balance, Decreased endurance, Decreased mobility  Visit Diagnosis: 1. Pelvic pain   2. Muscle weakness (generalized)   3. Other lack of coordination        Problem  List There are no active problems to display for this patient.  Myles Gip PT, DPT [phone] 01/11/2019, 5:05 PM  [facility] [address] [place], [place], [address] Phone: [phone]   Fax:  [phone]  Name: Alizey Noren MRN: [id number] Date of Birth: 04/23/1991     Problem List There are no active problems to display for this patient.   Louie Casa 01/11/2019, 5:05 PM  [facility] [address] [place], [place], [address] Phone: [phone]   Fax:  [phone]  Name: Kenisha Lynds MRN: [id number] Date of Birth: 11-Jul-1990

## 2019-01-18 ENCOUNTER — Encounter: Payer: BC Managed Care – PPO | Admitting: Physical Therapy

## 2019-01-25 ENCOUNTER — Other Ambulatory Visit: Payer: Self-pay

## 2019-01-25 ENCOUNTER — Ambulatory Visit: Payer: BC Managed Care – PPO | Admitting: Physical Therapy

## 2019-01-25 ENCOUNTER — Encounter: Payer: Self-pay | Admitting: Physical Therapy

## 2019-01-25 DIAGNOSIS — R102 Pelvic and perineal pain: Secondary | ICD-10-CM | POA: Diagnosis not present

## 2019-01-25 DIAGNOSIS — R278 Other lack of coordination: Secondary | ICD-10-CM

## 2019-01-25 DIAGNOSIS — M6281 Muscle weakness (generalized): Secondary | ICD-10-CM

## 2019-01-25 NOTE — Therapy (Signed)
[facility] [address] [place], [place], [address] Phone: [phone]   Fax:  [phone]  Physical Therapy Treatment  Patient Details  Name: Doris Sutton MRN: [id number] Date of Birth: 1990-12-02 Referring Provider (PT): Heloise Ochoa, [place]   Encounter Date: 01/25/2019  PT End of Session - 01/25/19 1559    Visit Number  7    Number of Visits  9    Date for PT Re-Evaluation  02/07/19    PT Start Time  1553    PT Stop Time  1635    PT Time Calculation (min)  42 min    Activity Tolerance  Patient tolerated treatment well    Behavior During Therapy  [facility] for tasks assessed/performed       History reviewed. No pertinent past medical history.  Past Surgical History:  Procedure Laterality Date  . OVARIAN CYST REMOVAL Left 03/2015    There were no vitals filed for this visit.  Subjective Assessment - 01/25/19 1556    Subjective  Patient states that she has worked 15 days in a row due to other employees being out with covid/covid testing.       TREATMENT  Pre-treatment assessment: L ASIS slightly superior, L popliteal angle ~110 degrees  Manual Therapy: STM and TPR performed to B adductors and hamstrings to allow for decreased tension and pain and improved posture and function Patellar mobilizations with knee flexion/extension movement for decreased spasm and improved hamstring and adductor mobility mobility, grade II/III   Neuromuscular Re-education: B Progressive/regressive angular isometric loading for improved hamstring extensibility and pelvic positioning, multiple bouts 10:10. L>R tightness, both able to achieve popliteal angle ~150 degrees by end of session Standing B hamstring stretch with breath coordinated PFM lengthening at chair for support, x2 min for improved pelvic positioning and improved posture Body mechanics education and practice for split stance lift from floor to support continued adductor lengthening  with decreased back strain and posterior pelvic tilt.   Patient educated throughout session on appropriate technique and form using multi-modal cueing, HEP, and activity modification. Patient articulated understanding and returned demonstration.  Patient Response to interventions: Denies any increased pelvic pain.  ASSESSMENT Patient presents to clinic with excellent motivation to participate in therapy. Patient demonstrates deficits in posture, pain, strength, coordination as evidenced by increased trigger points and hypertonicity in R adductor group. Patient able to achieve approximately 150 degrees of L popliteal angle (~40 degree intra-session improvement) during today's session and responded positively to neuromuscular re-education interventions. Patient's condition has the potential to improve in response to therapy. Maximum improvement is yet to be obtained. The anticipated improvement is attainable and reasonable in a generally predictable time. Patient will benefit from continued skilled therapeutic intervention to address remaining deficits in posture, pain, strength, coordination in order to increase function and improve overall QOL.       PT Long Term Goals - 01/25/19 1646      PT LONG TERM GOAL #1   Title  Patient will indicate at least a 7 point difference on the NIH-CPSI Female form to demonstrate clinically significant improvement in pain severity for improved overall QOL.    Baseline  IE: 20/43    Time  8    Period  Weeks    Status  On-going    Target Date  03/22/19      PT LONG TERM GOAL #2   Title  Patient will decrease worst pain as reported on NPRS by at least 2  points to demonstrate clinically significant reduction in pain in order to restore/improve function and overall QOL.    Baseline  IE: 7/10    Time  8    Period  Weeks    Status  On-going    Target Date  03/22/19      PT LONG TERM GOAL #3   Title  Patient will demonstrate understanding of basic  self-management/down-regulation of the nervous system for persistent pain condition and stress as evidenced by diaphragmatic breathing without cueing, body scan/progressive relaxation meditation, and improved sleep hygiene in order to transition to independent management of patient's chief complaint: recurrent pelvic pain.    Baseline  IE: diaphragmatic breath taught    Time  8    Period  Weeks    Status  On-going    Target Date  03/22/19      PT LONG TERM GOAL #4   Title  Patient will report less than 50% incidents of urge urinary incontinence post-voiding over the course of 3 weeks while in order to demonstrate improved PFM coordination, strength, and function for improved overall QOL.    Baseline  IE: 100%    Time  8    Period  Weeks    Status  On-going    Target Date  03/22/19            Plan - 01/25/19 1559    Clinical Impression Statement  Patient presents to clinic with excellent motivation to participate in therapy. Patient demonstrates deficits in posture, pain, strength, coordination as evidenced by increased trigger points and hypertonicity in R adductor group. Patient able to achieve approximately 150 degrees of L popliteal angle (~40 degree intra-session improvement) during today's session and responded positively to neuromuscular re-education interventions. Patient's condition has the potential to improve in response to therapy. Maximum improvement is yet to be obtained. The anticipated improvement is attainable and reasonable in a generally predictable time. Patient will benefit from continued skilled therapeutic intervention to address remaining deficits in posture, pain, strength, coordination in order to increase function and improve overall QOL.    Personal Factors and Comorbidities  Age;Sex;Social Background;Behavior Pattern;Fitness;Time since onset of injury/illness/exacerbation;Past/Current Experience    Examination-Activity Limitations  Squat;Lift;Locomotion  Level;Continence;Stand;Stairs;Carry;Caring for Others;Bend    Examination-Participation Restrictions  Laundry;Cleaning;Yard Work;Interpersonal Relationship;Meal Prep    Stability/Clinical Decision Making  Evolving/Moderate complexity    Rehab Potential  Good    PT Frequency  1x / week    PT Duration  8 weeks    PT Treatment/Interventions  ADLs/Self Care Home Management;Aquatic Therapy;Cryotherapy;Electrical Stimulation;Moist Heat;Biofeedback;Gait training;Stair training;Functional mobility training;Therapeutic activities;Therapeutic exercise;Balance training;Patient/family education;Neuromuscular re-education;Manual techniques;Dry needling;Joint Manipulations;Spinal Manipulations;Taping;Scar mobilization    PT Next Visit Plan  Mobilizations with movement; PFM lengthening coordination    PT Home Exercise Plan  adductor and PFM lengthening stretches, diaphragmatic breathing    Consulted and Agree with Plan of Care  Patient       Patient will benefit from skilled therapeutic intervention in order to improve the following deficits and impairments:  Abnormal gait, Difficulty walking, Increased muscle spasms, Improper body mechanics, Decreased coordination, Decreased strength, Postural dysfunction, Pain, Increased fascial restricitons, Decreased balance, Decreased endurance, Decreased mobility  Visit Diagnosis: Pelvic pain  Muscle weakness (generalized)  Other lack of coordination     Problem List There are no active problems to display for this patient.  Myles Gip PT, DPT [phone] 01/25/2019, 4:48 PM  [facility] [address] [place], [place], [address] Phone: [phone]  Fax:  [phone]  Name: Ninfa MeekerKelly Barnett MRN: [id number] Date of Birth: 11-24-1990

## 2019-03-16 ENCOUNTER — Ambulatory Visit: Payer: BC Managed Care – PPO | Attending: Obstetrics and Gynecology | Admitting: Physical Therapy

## 2019-04-22 ENCOUNTER — Ambulatory Visit
Admission: EM | Admit: 2019-04-22 | Discharge: 2019-04-22 | Disposition: A | Discharge status: Home / Self Care | Payer: BC Managed Care – PPO | Attending: Emergency Medicine | Admitting: Emergency Medicine

## 2019-04-22 ENCOUNTER — Other Ambulatory Visit: Payer: Self-pay

## 2019-04-22 DIAGNOSIS — J029 Acute pharyngitis, unspecified: Secondary | ICD-10-CM | POA: Diagnosis not present

## 2019-04-22 LAB — RAPID STREP SCREEN (MED CTR MEBANE ONLY): Streptococcus, Group A Screen (Direct): NEGATIVE

## 2019-04-22 MED ORDER — IBUPROFEN 600 MG PO TABS: 600.0000 mg | ORAL_TABLET | Freq: Four times a day (QID) | ORAL | 0 refills | Status: DC | PRN

## 2019-04-22 MED ORDER — FLUTICASONE PROPIONATE 50 MCG/ACT NA SUSP: 2.0000 | Freq: Every day | NASAL | 0 refills | Status: DC

## 2019-04-22 NOTE — Discharge Instructions (Addendum)
Your Covid test will take 24 to 48 hours to come back.  Please isolate yourself until you have the results.  In the meantime, try some Allegra, Flonase. your rapid strep was negative today, so we have sent off a throat culture.  We will contact you and call in the appropriate antibiotics if your culture comes back positive for an infection requiring antibiotic treatment.  Give [place] a working phone number.  1 gram of Tylenol and 600 mg ibuprofen together 3-4 times a day as needed for pain.  Make sure you drink plenty of extra fluids.  Some people find salt water gargles and  Traditional Medicinal's "Throat Coat" tea helpful. Take 5 mL of liquid Benadryl and 5 mL of Maalox. Mix it together, and then hold it in your mouth for as long as you can and then swallow. You may do this 4 times a day.    Here is a list of primary care providers who are taking new patients:  Dr. Otilio Miu, Dr. Adline Potter [address] [place] [address] [place] [address]  [phone]  [facility] [address] [employer] [employer], [place] [address] [phone]  [facility] [place]  [phone] [place], [place] [address]  Here are clinics/ other resources who will see you if you do not have insurance. Some have certain criteria that you must meet. Call them and find out what they are:  [facility]: [address] [place], [place] [address] Phone: [phone] Hours: First and Third Saturdays of each Month, 9 a.m. - 1 p.m.  [facility]: [address] [place], [place], [place] [address] Phone: [phone] Hours: Tuesday, 4 p.m. - 8 p.m. Thursday, 1 p.m. - 8 p.m. Wednesday, 9 a.m. - [facility] [address], [place], [place] [address] Phone: [phone] Pharmacy Phone Number: [phone] Dental Phone Number: [phone] [place] Help: [phone]  Dental  Hours: Monday - Thursday, 8 a.m. - 6 p.m.  [place] [address], [place], [place] [address] Phone: [phone] Pharmacy Phone Number: [phone] [facility] [place] Insurance Help: [phone]  [facility] [address] [place], [place] [address] Phone: [phone] Pharmacy Phone Number: [phone] [facility] Insurance Help: [phone]  [facility] [address] [place], [place] [address] Phone: [phone] [facility] Insurance Help: [phone]   [address] [place], [place] [address] Phone: [phone]  Go to www.goodrx.com to look up your medications. This will give you a list of where you can find your prescriptions at the most affordable prices. Or ask the pharmacist what the cash price is, or if they have any other discount programs available to help make your medication more affordable. This can be less expensive than what you would pay with insurance.

## 2019-04-22 NOTE — ED Triage Notes (Signed)
Patient states that she was exposed to Covid by a co-worker and went and had a covid test yesterday, it was negative. Patient states that she has continued to have a sore throat since Sunday and is having wheezing and sweating.

## 2019-04-22 NOTE — ED Provider Notes (Addendum)
HPI  SUBJECTIVE:  Patient reports sore throat starting 5 days ago.  No aggravating factors.  No alleviating factors.  She has been trying vitamin C Gummies, Zicam, Tylenol.    No fevers, but reports chills and "feeling sweaty". + Swollen neck glands   No neck stiffness  No Cough No nasal congestion, rhinorrhea + Postnasal drip No Myalgias + Headache yesterday, this has resolved. No Rash  No loss of taste or smell + shortness of breath yesterday.  This has resolved. No nausea, vomiting No diarrhea No abdominal pain     No Recent Strep exposure.  She states that a coworker's family member tested positive for Covid.  She states that she personally had a negative test yesterday. No reflux sxs + Allergy sxs-attributing sore throat to allergies  No Breathing difficulty, voice changes, sensation of throat swelling shut No Drooling No Trismus No abx in past month.   No antipyretic in past 4-6 hrs  Past medical history of frequent strep status post tonsillectomy, mono in high school, allergies.  No history of GERD, diabetes, hypertension. LMP: Now.  Denies possibility being pregnant.   PMD: None.   History reviewed. No pertinent past medical history.  Past Surgical History:  Procedure Laterality Date  . OVARIAN CYST REMOVAL Left 03/2015  . TONSILLECTOMY      History reviewed. No pertinent family history.  Social History   Tobacco Use  . Smoking status: Never Smoker  . Smokeless tobacco: Never Used  Substance Use Topics  . Alcohol use: Yes    Comment: occasionally  . Drug use: Never    No current facility-administered medications for this encounter.   Current Outpatient Medications:  .  norethindrone-ethinyl estradiol (FEMHRT 1/5) 1-5 MG-MCG TABS tablet, Take 1 tablet by mouth daily., Disp: , Rfl:  .  Prenatal Vit-Fe Fumarate-FA (PRENATAL MULTIVITAMIN) TABS tablet, Take 1 tablet by mouth daily at 12 noon., Disp: , Rfl:   No Known Allergies   ROS  As noted in  HPI.   Physical Exam  BP 112/71 (BP Location: Left Arm)   Pulse 62   Temp 98.2 F (36.8 C) (Oral)   Resp 16   Ht 5\' 7"  (1.702 m)   Wt 65.8 kg   LMP 04/16/2019   SpO2 98%   BMI 22.71 kg/m   Constitutional: Well developed, well nourished, no acute distress Eyes:  EOMI, conjunctiva normal bilaterally HENT: Normocephalic, atraumatic,mucus membranes moist. + Clear nasal congestion, erythematous, swollen turbinates + slightly erythematous oropharynx.  Tonsils surgically absent.  No petechiae on palate.  Uvula midline.  Positive postnasal drip. Respiratory: Normal inspiratory effort Cardiovascular: Normal rate, no murmurs, rubs, gallops GI: nondistended, nontender. No appreciable splenomegaly skin: No rash, skin intact Lymph: No anterior cervical LN.  No posterior cervical lymphadenopathy Musculoskeletal: no deformities Neurologic: Alert & oriented x 3, no focal neuro deficits Psychiatric: Speech and behavior appropriate.  ED Course   Medications - No data to display  Orders Placed This Encounter  Procedures  . Rapid Strep Screen ([facility] ONLY)    Standing Status:   Standing    Number of Occurrences:   1    Order Specific Question:   Patient immune status    Answer:   Normal  . Culture, group A strep    Standing Status:   Standing    Number of Occurrences:   1    Results for orders placed or performed during the hospital encounter of 04/22/19 (from the past 24 hour(s))  Rapid  Strep Screen ([facility] ONLY)     Status: None   Collection Time: 04/22/19  8:50 AM   Specimen: Oral Mucosa/Gingiva; Other  Result Value Ref Range   Streptococcus, Group A Screen (Direct) NEGATIVE NEGATIVE   No results found.  ED Clinical Impression  1. Pharyngitis, unspecified etiology      ED Assessment/Plan   Rapid strep negative. Obtaining throat culture to guide antibiotic treatment.  Covid test sent.  Discussed this with patient. We'll contact them if culture is positive,  and will call in Appropriate antibiotics. Patient home with ibuprofen, Tylenol, Benadryl/Maalox mixture, Flonase, start Allegra.. Patient to followup with PMD when necessary, will refer to local primary care resources.   Discussed labs,  MDM, plan and followup with patient. Discussed sn/sx that should prompt return to the ED. patient agrees with plan.   No orders of the defined types were placed in this encounter.    *This clinic note was created using Dragon dictation software. Therefore, there may be occasional mistakes despite careful proofreading.     Domenick Gong, MD 04/22/19 1041    Domenick Gong, MD 04/22/19 1041

## 2019-04-23 LAB — NOVEL CORONAVIRUS, NAA (HOSP ORDER, SEND-OUT TO REF LAB; TAT 18-24 HRS): SARS-CoV-2, NAA: NOT DETECTED

## 2019-04-24 LAB — CULTURE, GROUP A STREP (THRC)

## 2019-09-27 ENCOUNTER — Encounter: Payer: Self-pay | Admitting: Emergency Medicine

## 2019-09-27 ENCOUNTER — Other Ambulatory Visit: Payer: Self-pay

## 2019-09-27 DIAGNOSIS — O034 Incomplete spontaneous abortion without complication: Secondary | ICD-10-CM | POA: Insufficient documentation

## 2019-09-27 DIAGNOSIS — R102 Pelvic and perineal pain: Secondary | ICD-10-CM | POA: Insufficient documentation

## 2019-09-27 DIAGNOSIS — Z79899 Other long term (current) drug therapy: Secondary | ICD-10-CM | POA: Diagnosis not present

## 2019-09-27 DIAGNOSIS — Z3A01 Less than 8 weeks gestation of pregnancy: Secondary | ICD-10-CM | POA: Diagnosis not present

## 2019-09-27 DIAGNOSIS — Z793 Long term (current) use of hormonal contraceptives: Secondary | ICD-10-CM | POA: Diagnosis not present

## 2019-09-27 LAB — CBC WITH DIFFERENTIAL/PLATELET
Abs Immature Granulocytes: 0.04 10*3/uL (ref 0.00–0.07)
Basophils Absolute: 0 10*3/uL (ref 0.0–0.1)
Basophils Relative: 0 %
Eosinophils Absolute: 0 10*3/uL (ref 0.0–0.5)
Eosinophils Relative: 0 %
HCT: 40.4 % (ref 36.0–46.0)
Hemoglobin: 13.5 g/dL (ref 12.0–15.0)
Immature Granulocytes: 0 %
Lymphocytes Relative: 22 %
Lymphs Abs: 2.5 10*3/uL (ref 0.7–4.0)
MCH: 30.5 pg (ref 26.0–34.0)
MCHC: 33.4 g/dL (ref 30.0–36.0)
MCV: 91.2 fL (ref 80.0–100.0)
Monocytes Absolute: 0.7 10*3/uL (ref 0.1–1.0)
Monocytes Relative: 6 %
Neutro Abs: 8.1 10*3/uL — ABNORMAL HIGH (ref 1.7–7.7)
Neutrophils Relative %: 72 %
Platelets: 307 10*3/uL (ref 150–400)
RBC: 4.43 MIL/uL (ref 3.87–5.11)
RDW: 11.9 % (ref 11.5–15.5)
WBC: 11.3 10*3/uL — ABNORMAL HIGH (ref 4.0–10.5)
nRBC: 0 % (ref 0.0–0.2)

## 2019-09-27 LAB — COMPREHENSIVE METABOLIC PANEL
ALT: 13 U/L (ref 0–44)
AST: 16 U/L (ref 15–41)
Albumin: 4.6 g/dL (ref 3.5–5.0)
Alkaline Phosphatase: 45 U/L (ref 38–126)
Anion gap: 9 (ref 5–15)
BUN: 10 mg/dL (ref 6–20)
CO2: 27 mmol/L (ref 22–32)
Calcium: 9.3 mg/dL (ref 8.9–10.3)
Chloride: 104 mmol/L (ref 98–111)
Creatinine, Ser: 0.93 mg/dL (ref 0.44–1.00)
GFR calc Af Amer: >60 mL/min (ref >60)
GFR calc non Af Amer: >60 mL/min (ref >60)
Glucose, Bld: 91 mg/dL (ref 70–99)
Potassium: 3.5 mmol/L (ref 3.5–5.1)
Sodium: 140 mmol/L (ref 135–145)
Total Bilirubin: 0.7 mg/dL (ref 0.3–1.2)
Total Protein: 7.5 g/dL (ref 6.5–8.1)

## 2019-09-27 LAB — URINALYSIS, COMPLETE (UACMP) WITH MICROSCOPIC
Bilirubin Urine: NEGATIVE
Glucose, UA: NEGATIVE mg/dL
Ketones, ur: NEGATIVE mg/dL
Nitrite: NEGATIVE
Protein, ur: 30 mg/dL — AB
RBC / HPF: >50 RBC/hpf — ABNORMAL HIGH (ref 0–5)
Specific Gravity, Urine: 1.023 (ref 1.005–1.030)
WBC, UA: >50 WBC/hpf — ABNORMAL HIGH (ref 0–5)
pH: 6 (ref 5.0–8.0)

## 2019-09-27 NOTE — ED Notes (Signed)
Heloise Ochoa, midwife, called to inform that patient had recent miscarriage and seen at [facility] today with US done.  Lurena Joiner asked to be paged when seen by provider.

## 2019-09-27 NOTE — ED Triage Notes (Signed)
Pt reports a miscarriage 1 week  Ago.  Pt continues to have bleeding and abd pain with cramps.  Pt alert.

## 2019-09-28 ENCOUNTER — Emergency Department: Payer: BC Managed Care – PPO

## 2019-09-28 ENCOUNTER — Emergency Department
Admission: EM | Admit: 2019-09-28 | Discharge: 2019-09-28 | Disposition: A | Discharge status: Home / Self Care | Payer: BC Managed Care – PPO | Attending: Emergency Medicine | Admitting: Emergency Medicine

## 2019-09-28 DIAGNOSIS — R102 Pelvic and perineal pain: Secondary | ICD-10-CM

## 2019-09-28 DIAGNOSIS — O034 Incomplete spontaneous abortion without complication: Secondary | ICD-10-CM

## 2019-09-28 DIAGNOSIS — N939 Abnormal uterine and vaginal bleeding, unspecified: Secondary | ICD-10-CM

## 2019-09-28 LAB — HCG, QUANTITATIVE, PREGNANCY: hCG, Beta Chain, Quant, S: 101 m[IU]/mL — ABNORMAL HIGH (ref <5)

## 2019-09-28 MED ORDER — MORPHINE SULFATE (PF) 2 MG/ML IV SOLN
INTRAVENOUS | Status: AC
  Filled 2019-09-28: qty 1

## 2019-09-28 MED ORDER — TRAMADOL HCL 50 MG PO TABS: 50.0000 mg | ORAL_TABLET | Freq: Four times a day (QID) | ORAL | 0 refills | Status: DC | PRN

## 2019-09-28 MED ORDER — MORPHINE SULFATE (PF) 2 MG/ML IV SOLN
2.0000 mg | Freq: Once | INTRAVENOUS | Status: AC
  Administered 2019-09-28: 2 mg via INTRAVENOUS
  Filled 2019-09-28: qty 1

## 2019-09-28 MED ORDER — MISOPROSTOL 200 MCG PO TABS
400.0000 ug | ORAL_TABLET | Freq: Once | ORAL | Status: AC
  Administered 2019-09-28: 400 ug via ORAL
  Filled 2019-09-28 (×2): qty 2

## 2019-09-28 MED ORDER — HYDROCODONE-ACETAMINOPHEN 5-325 MG PO TABS
1.0000 | ORAL_TABLET | Freq: Once | ORAL | Status: AC
  Administered 2019-09-28: 1 via ORAL
  Filled 2019-09-28: qty 1

## 2019-09-28 MED ORDER — MORPHINE SULFATE (PF) 2 MG/ML IV SOLN
2.0000 mg | Freq: Once | INTRAVENOUS | Status: AC
  Administered 2019-09-28: 2 mg via INTRAVENOUS

## 2019-09-28 MED ORDER — ONDANSETRON HCL 4 MG/2ML IJ SOLN
4.0000 mg | Freq: Once | INTRAMUSCULAR | Status: AC
  Administered 2019-09-28: 04:00:00 4 mg via INTRAVENOUS
  Filled 2019-09-28: qty 2

## 2019-09-28 NOTE — ED Provider Notes (Signed)
[facility] Emergency Department Provider Note  ____________________________________________   First MD Initiated Contact with Patient 09/28/19 [phone]     (approximate)  I have reviewed the triage vital signs and the nursing notes.   HISTORY  Chief Complaint Vaginal Bleeding and Abdominal Pain    HPI Doris Sutton is a 29 y.o. female G2 P1 with miscarriage 2 weeks ago at approximately 6 weeks of gestation presents to the emergency department secondary to worsening pelvic cramping and vaginal bleeding tonight.  Patient states that her bleeding had improved over the weekend and then suddenly became worse tonight.  Patient did notify Heloise Ochoa with OB/GYN who advised the patient to come to the emergency department.  Patient states that her current pain score is 4 out of 10.  Patient states that her hCG quantitative is been decreasing consistently as well.    No past medical history on file.  There are no problems to display for this patient.   Past Surgical History:  Procedure Laterality Date  . OVARIAN CYST REMOVAL Left 03/2015  . TONSILLECTOMY      Prior to Admission medications   Medication Sig Start Date End Date Taking? Authorizing Provider  fluticasone (FLONASE) 50 MCG/ACT nasal spray Place 2 sprays into both nostrils daily. 04/22/19   Domenick Gong, MD  ibuprofen (ADVIL) 600 MG tablet Take 1 tablet (600 mg total) by mouth every 6 (six) hours as needed. 04/22/19   Domenick Gong, MD  norethindrone-ethinyl estradiol (FEMHRT 1/5) 1-5 MG-MCG TABS tablet Take 1 tablet by mouth daily.  04/22/19  [provider]    Allergies Patient has no known allergies.  No family history on file.  Social History Social History   Tobacco Use  . Smoking status: Never Smoker  . Smokeless tobacco: Never Used  Substance Use Topics  . Alcohol use: Not Currently    Comment: occasionally  . Drug use: Never    Review of  Systems Constitutional: No fever/chills Eyes: No visual changes. ENT: No sore throat. Cardiovascular: Denies chest pain. Respiratory: Denies shortness of breath. Gastrointestinal: No abdominal pain.  No nausea, no vomiting.  No diarrhea.  No constipation. Genitourinary: Negative for dysuria. Musculoskeletal: Negative for neck pain.  Negative for back pain. Integumentary: Negative for rash. Neurological: Negative for headaches, focal weakness or numbness.  ____________________________________________   PHYSICAL EXAM:  VITAL SIGNS: ED Triage Vitals  Enc Vitals Group     BP 09/27/19 2254 119/80     Pulse Rate 09/27/19 2254 (!) 102     Resp 09/27/19 2254 20     Temp 09/27/19 2254 98.4 F (36.9 C)     Temp Source 09/27/19 2254 Oral     SpO2 09/27/19 2254 97 %     Weight 09/27/19 2309 61.7 kg (136 lb)     Height 09/27/19 2309 1.702 m (5\' 7" )     Head Circumference --      Peak Flow --      Pain Score 09/27/19 2321 4     Pain Loc --      Pain Edu? --      Excl. in GC? --     Constitutional: Alert and oriented.  Eyes: Conjunctivae are normal.  Mouth/Throat: Patient is wearing a mask. Neck: No stridor.  No meningeal signs.   Cardiovascular: Normal rate, regular rhythm. Good peripheral circulation. Grossly normal heart sounds. Respiratory: Normal respiratory effort.  No retractions. Gastrointestinal: Soft and nontender. No distention.  Musculoskeletal: No lower extremity tenderness nor  edema. No gross deformities of extremities. Neurologic:  Normal speech and language. No gross focal neurologic deficits are appreciated.  Skin:  Skin is warm, dry and intact. Psychiatric: Mood and affect are normal. Speech and behavior are normal.  ____________________________________________   LABS (all labs ordered are listed, but only abnormal results are displayed)  Labs Reviewed  CBC WITH DIFFERENTIAL/PLATELET - Abnormal; Notable for the following components:      Result Value   WBC  11.3 (*)    Neutro Abs 8.1 (*)    All other components within normal limits  URINALYSIS, COMPLETE (UACMP) WITH MICROSCOPIC - Abnormal; Notable for the following components:   Color, Urine YELLOW (*)    APPearance CLOUDY (*)    Hgb urine dipstick LARGE (*)    Protein, ur 30 (*)    Leukocytes,Ua LARGE (*)    RBC / HPF >50 (*)    WBC, UA >50 (*)    Bacteria, UA RARE (*)    All other components within normal limits  HCG, QUANTITATIVE, PREGNANCY - Abnormal; Notable for the following components:   hCG, Beta Chain, Quant, S 101 (*)    All other components within normal limits  COMPREHENSIVE METABOLIC PANEL     RADIOLOGY I, Hamlin N Laprecious Austill, personally viewed and evaluated these images (plain radiographs) as part of my medical decision making, as well as reviewing the written report by the radiologist.  ED MD interpretation: Findings which could be suggestive of small area of retained products of conception on ultrasound per radiologist.  Official radiology report(s): US PELVIC COMPLETE WITH TRANSVAGINAL  Result Date: 09/28/2019 CLINICAL DATA:  Miscarriage week ago continued pelvic pain EXAM: TRANSABDOMINAL AND TRANSVAGINAL ULTRASOUND OF PELVIS TECHNIQUE: Both transabdominal and transvaginal ultrasound examinations of the pelvis were performed. Transabdominal technique was performed for global imaging of the pelvis including uterus, ovaries, adnexal regions, and pelvic cul-de-sac. It was necessary to proceed with endovaginal exam following the transabdominal exam to visualize the . COMPARISON:  None FINDINGS: Uterus Measurements: 7.1x3.7 x 4.9 cm = volume: 66 mL. No fibroids or other mass visualized. Endometrium Thickness: 6.1 mm. Heterogeneous areas of increased vascularity seen within the endometrium. Right ovary Measurements: 2.9 x 1.9 x 3.4 cm = volume: 10 mL. Normal appearance/no adnexal mass. Left ovary Measurements: 3.2 x 2.2 x 2.0 cm = volume: 7.5 mL. Normal appearance/no adnexal mass.  Other findings No abnormal free fluid. IMPRESSION: Findings which could be suggestive small area of retained products of conception. Electronically Signed   By: Jonna Clark M.D.   On: 09/28/2019 06:19    _______________________________________  Procedures   ____________________________________________   INITIAL IMPRESSION / MDM / ASSESSMENT AND PLAN / ED COURSE  As part of my medical decision making, I reviewed the following data within the electronic MEDICAL RECORD NUMBER   29 year old female with above-stated history and physical exam a differential diagnosis including but not limited to retained products of conception, miscarriage.  Ultrasound performed revealed a small area of possible retained products of conception.  Patient discussed with Gevena Barre [facility] OB[facility]GYN) who stated that she discussed the patient with Dr. Elesa Massed who recommended that the patient receive Cytotec 400 mcg which was administered in the emergency department.  Patient advised to follow-up with OB/GYN today  ____________________________________________  FINAL CLINICAL IMPRESSION(S) / ED DIAGNOSES  Final diagnoses:  Retained products of conception after miscarriage     MEDICATIONS GIVEN DURING THIS VISIT:  Medications  misoprostol (CYTOTEC) tablet 400 mcg (has no administration in time  range)  morphine 2 MG/ML injection 2 mg (2 mg Intravenous Given 09/28/19 0423)  ondansetron (ZOFRAN) injection 4 mg (4 mg Intravenous Given 09/28/19 0423)     ED Discharge Orders    None      *Please note:  Doris Sutton was evaluated in Emergency Department on 09/28/2019 for the symptoms described in the history of present illness. She was evaluated in the context of the global COVID-19 pandemic, which necessitated consideration that the patient might be at risk for infection with the SARS-CoV-2 virus that causes COVID-19. Institutional protocols and algorithms that pertain to the evaluation of patients at risk for  COVID-19 are in a state of rapid change based on information released by regulatory bodies including the CDC and federal and state organizations. These policies and algorithms were followed during the patient's care in the ED.  Some ED evaluations and interventions may be delayed as a result of limited staffing during the pandemic.*  Note:  This document was prepared using Dragon voice recognition software and may include unintentional dictation errors.   Gregor Hams, MD 09/28/19 [phone]

## 2019-09-28 NOTE — ED Notes (Signed)
Pt reports recent miscarriage w heavy bleeding x2 weeks, starting Tuesday. Pt reports passing bright red blood, followed by dark Birtha Hatler blood with golf ball size clots. Pt states the bleeding got better then today she began having lower abd cramping and tenderness with heavy bright red bleeding. Pt also reports sudden sharp back pain today. Pt took tylenol around 8:30pm and a percocet around 9:45pm with some relief. Pain currently 3/10

## 2019-09-28 NOTE — ED Notes (Signed)
Pt transported to US

## 2019-09-28 NOTE — ED Notes (Signed)
Assumed care of patient, reports ready to leave 0/10 pain after being medicated this morning. Vss. Family at bedside. Patient awaiting pharmacy derived medication (Cytotec) and discharge instructions.

## 2019-12-28 ENCOUNTER — Encounter: Payer: Self-pay | Admitting: Emergency Medicine

## 2019-12-28 ENCOUNTER — Other Ambulatory Visit: Payer: Self-pay

## 2019-12-28 ENCOUNTER — Ambulatory Visit
Admission: EM | Admit: 2019-12-28 | Discharge: 2019-12-28 | Disposition: A | Discharge status: Home / Self Care | Payer: BC Managed Care – PPO | Attending: Emergency Medicine | Admitting: Emergency Medicine

## 2019-12-28 DIAGNOSIS — J069 Acute upper respiratory infection, unspecified: Secondary | ICD-10-CM | POA: Diagnosis present

## 2019-12-28 DIAGNOSIS — Z20822 Contact with and (suspected) exposure to covid-19: Secondary | ICD-10-CM | POA: Diagnosis present

## 2019-12-28 LAB — PREGNANCY, URINE: Preg Test, Ur: NEGATIVE

## 2019-12-28 MED ORDER — FLUTICASONE PROPIONATE 50 MCG/ACT NA SUSP: 2.0000 | Freq: Every day | NASAL | 0 refills | Status: AC

## 2019-12-28 MED ORDER — IBUPROFEN 600 MG PO TABS: 600.0000 mg | ORAL_TABLET | Freq: Four times a day (QID) | ORAL | 0 refills | Status: DC | PRN

## 2019-12-28 NOTE — ED Provider Notes (Addendum)
HPI  SUBJECTIVE:  Doris Sutton is a 29 y.o. female who presents with 2 days of sore throat, sneezing, earache, nasal congestion, green rhinorrhea and postnasal drip.  She reports sinus pain and pressure and occasional cough secondary to the postnasal drip.  She was exposed to Covid 11 days ago.  States that her brother-in-law and sister who had the same exposure started having URI symptoms yesterday.  They have not yet been tested.  She denies any further exposure to Covid.  No fevers, body, headaches, loss of sense of smell or taste, upper dental pain, facial swelling.  No shortness of breath, nausea, vomiting, diarrhea.  No allergy symptoms.she has not yet gotten the Covid vaccine.  No antibiotics in the past month. No  Antipyretic in the past 4 to 6 hours.  She tried Tylenol and OTC cold medication with improvement in her symptoms.  No aggravating factors.  She is status post tonsillectomy has a history of allergies.  No history of frequent sinusitis, diabetes, hypertension.  LMP: 3 weeks ago.  She is not sure if she could be pregnant.  Is not using any birth control.  PMD: None.   History reviewed. No pertinent past medical history.  Past Surgical History:  Procedure Laterality Date  . OVARIAN CYST REMOVAL Left 03/2015  . TONSILLECTOMY      History reviewed. No pertinent family history.  Social History   Tobacco Use  . Smoking status: Never Smoker  . Smokeless tobacco: Never Used  Vaping Use  . Vaping Use: Never used  Substance Use Topics  . Alcohol use: Not Currently    Comment: occasionally  . Drug use: Never    No current facility-administered medications for this encounter.  Current Outpatient Medications:  .  fluticasone (FLONASE) 50 MCG/ACT nasal spray, Place 2 sprays into both nostrils daily., Disp: 16 g, Rfl: 0 .  ibuprofen (ADVIL) 600 MG tablet, Take 1 tablet (600 mg total) by mouth every 6 (six) hours as needed., Disp: 30 tablet, Rfl: 0  No Known  Allergies   ROS  As noted in HPI.   Physical Exam  BP (!) 123/90 (BP Location: Right Arm)   Pulse 86   Temp 99 F (37.2 C) (Oral)   Resp 18   Ht 5\' 7"  (1.702 m)   Wt 65.8 kg   LMP 12/07/2019   SpO2 100%   BMI 22.71 kg/m   Constitutional: Well developed, well nourished, no acute distress Eyes:  EOMI, conjunctiva normal bilaterally HENT: Normocephalic, atraumatic,mucus membranes moist.  TMs normal bilaterally, clear rhinorrhea.  Normal turbinates.  No maxillary, frontal sinus tenderness.  Tonsils surgically absent.  Erythematous oropharynx.  No petechiae on palate.  Uvula midline.  Positive postnasal drip.  Positive cobblestoning. Neck: No cervical lymphadenopathy Respiratory: Normal inspiratory effort, lungs clear bilaterally good air movement Cardiovascular: Normal rate regular rhythm no murmurs rubs or gallops GI: nondistended skin: No rash, skin intact Musculoskeletal: no deformities Neurologic: Alert & oriented x 3, no focal neuro deficits Psychiatric: Speech and behavior appropriate   ED Course   Medications - No data to display  Orders Placed This Encounter  Procedures  . SARS CORONAVIRUS 2 (TAT 6-24 HRS) Nasopharyngeal Nasopharyngeal Swab    Standing Status:   Standing    Number of Occurrences:   1    Order Specific Question:   Is this test for diagnosis or screening    Answer:   Diagnosis of ill patient    Order Specific Question:   Symptomatic for  COVID-19 as defined by CDC    Answer:   Yes    Order Specific Question:   Date of Symptom Onset    Answer:   12/27/2019    Order Specific Question:   Hospitalized for COVID-19    Answer:   No    Order Specific Question:   Admitted to ICU for COVID-19    Answer:   No    Order Specific Question:   Previously tested for COVID-19    Answer:   Yes    Order Specific Question:   Resident in a congregate (group) care setting    Answer:   No    Order Specific Question:   Employed in healthcare setting    Answer:    Yes    Order Specific Question:   Pregnant    Answer:   No    Order Specific Question:   Has patient completed COVID vaccination(s) (2 doses of Pfizer/Moderna 1 dose of [employer])    Answer:   No  . Pregnancy, urine    Standing Status:   Standing    Number of Occurrences:   1    No results found for this or any previous visit (from the past 24 hour(s)). No results found.  ED Clinical Impression  1. Upper respiratory tract infection, unspecified type   2. Encounter for laboratory testing for COVID-19 virus      ED Assessment/Plan  Checking urine pregnancy, patient states that she is not actively trying, but they are not using protection either.  She is not sure if she could be pregnant   urine pregnancy negative..  Covid PCR sent.  Will send home with ibuprofen/Tylenol, Mucinex or Mucinex D, saline nasal irrigation, Flonase.  Benadryl/Maalox as needed for sore throat but I suspect the sore throat is from the postnasal drip.  Primary care list for ongoing care.  Covid negative.   Discussed labs, MDM, treatment plan, and plan for follow-up with patient. Discussed sn/sx that should prompt return to the ED. patient agrees with plan.   Meds ordered this encounter  Medications  . fluticasone (FLONASE) 50 MCG/ACT nasal spray    Sig: Place 2 sprays into both nostrils daily.    Dispense:  16 g    Refill:  0  . ibuprofen (ADVIL) 600 MG tablet    Sig: Take 1 tablet (600 mg total) by mouth every 6 (six) hours as needed.    Dispense:  30 tablet    Refill:  0    *This clinic note was created using Scientist, clinical (histocompatibility and immunogenetics). Therefore, there may be occasional mistakes despite careful proofreading.   ?    Domenick Gong, MD 12/30/19 4401    Domenick Gong, MD 12/30/19 [phone]

## 2019-12-28 NOTE — Discharge Instructions (Addendum)
Your urine pregnancy was negative.  You will get your Covid test through MyChart. Take 1 gram of tylenol with the 600 mg motrin up to 3-4 times a day as needed for pain and fever. This is an effective combination. Drink extra fluids. Start taking  mucinex or Mucinex D to keep the mucus secretions thin. Use the [employer]Med sinus rinse with distilled water as often as you want to to reduce nasal congestion. Follow the directions on the box.  Flonase will help with the nasal congestion and postnasal drip.  Tablespoon of Maalox mixed with a tablespoon of children's Benadryl 3-4 times a day as needed for sore throat.  Here is a list of primary care providers who are taking new patients:  Dr. Elizabeth Sauer [address] [place] [address] [phone]  [facility] at [facility] [address] [place], [place] [address] [phone]  [facility] [place] [address] [place] [address]  [phone]  [facility] [address] [place], [place] [address] [phone]  [facility] [address]  [phone] [place], [place] [address]  Go to www.goodrx.com to look up your medications. This will give you a list of where you can find your prescriptions at the most affordable prices. Or ask the pharmacist what the cash price is, or if they have any other discount programs available to help make your medication more affordable. This can be less expensive than what you would pay with insurance.

## 2019-12-28 NOTE — ED Triage Notes (Signed)
Patient c/o cough and green nasal congestion that started yesterday.

## 2019-12-29 LAB — SARS CORONAVIRUS 2 (TAT 6-24 HRS): SARS Coronavirus 2: NEGATIVE

## 2020-04-01 DIAGNOSIS — R4586 Emotional lability: Secondary | ICD-10-CM | POA: Insufficient documentation

## 2020-08-31 ENCOUNTER — Other Ambulatory Visit: Payer: Self-pay

## 2020-08-31 ENCOUNTER — Ambulatory Visit (INDEPENDENT_AMBULATORY_CARE_PROVIDER_SITE_OTHER): Payer: BLUE CROSS/BLUE SHIELD | Admitting: Family Medicine

## 2020-08-31 ENCOUNTER — Encounter: Payer: Self-pay | Admitting: Family Medicine

## 2020-08-31 VITALS — BP 110/70 | HR 76 | Temp 97.7°F | Ht 67.0 in | Wt 143.0 lb

## 2020-08-31 DIAGNOSIS — Z7689 Persons encountering health services in other specified circumstances: Secondary | ICD-10-CM

## 2020-08-31 DIAGNOSIS — J329 Chronic sinusitis, unspecified: Secondary | ICD-10-CM

## 2020-08-31 MED ORDER — AMOXICILLIN-POT CLAVULANATE 875-125 MG PO TABS: 1.0000 | ORAL_TABLET | Freq: Two times a day (BID) | ORAL | 0 refills | Status: DC

## 2020-08-31 NOTE — Progress Notes (Signed)
Date:  08/31/2020   Name:  Doris Sutton   DOB:  August 28, 1990   MRN:  [id number]   Chief Complaint: Establish Care (Needed pcp) and Sinusitis (Started Augmentin on 08/15/20- 08/22/2020 still having congestion, green sticky production. No cough)  Patient is a 30 year old female who presents for an establish care exam. The patient reports the following problems: recurrent sinusitis. Health maintenance has been reviewed upto date  Sinusitis This is a chronic problem. The current episode started more than 1 month ago. The problem has been waxing and waning since onset. There has been no fever. Her pain is at a severity of 6/10. The pain is moderate. Associated symptoms include congestion, diaphoresis, headaches and sinus pressure. Pertinent negatives include no chills, coughing, ear pain, shortness of breath, sneezing, sore throat or swollen glands. Past treatments include antibiotics, oral decongestants and saline sprays. The treatment provided moderate relief.    Lab Results  Component Value Date   CREATININE 0.93 09/27/2019   BUN 10 09/27/2019   NA 140 09/27/2019   K 3.5 09/27/2019   CL 104 09/27/2019   CO2 27 09/27/2019   No results found for: CHOL, HDL, LDLCALC, LDLDIRECT, TRIG, CHOLHDL No results found for: TSH No results found for: HGBA1C Lab Results  Component Value Date   WBC 11.3 (H) 09/27/2019   HGB 13.5 09/27/2019   HCT 40.4 09/27/2019   MCV 91.2 09/27/2019   PLT 307 09/27/2019   Lab Results  Component Value Date   ALT 13 09/27/2019   AST 16 09/27/2019   ALKPHOS 45 09/27/2019   BILITOT 0.7 09/27/2019     Review of Systems  Constitutional: Positive for diaphoresis. Negative for chills, fatigue, fever and unexpected weight change.  HENT: Positive for congestion and sinus pressure. Negative for ear discharge, ear pain, postnasal drip, rhinorrhea, sneezing and sore throat.   Eyes: Negative for photophobia, pain, discharge, redness and itching.  Respiratory: Negative  for cough, shortness of breath, wheezing and stridor.   Gastrointestinal: Negative for abdominal pain, blood in stool, constipation, diarrhea, nausea and vomiting.  Endocrine: Negative for cold intolerance, heat intolerance, polydipsia, polyphagia and polyuria.  Genitourinary: Negative for dysuria, flank pain, frequency, hematuria, menstrual problem, pelvic pain, urgency, vaginal bleeding and vaginal discharge.  Musculoskeletal: Negative for arthralgias, back pain and myalgias.  Skin: Negative for rash.  Allergic/Immunologic: Negative for environmental allergies and food allergies.  Neurological: Positive for headaches. Negative for dizziness, weakness, light-headedness and numbness.  Hematological: Negative for adenopathy. Does not bruise/bleed easily.  Psychiatric/Behavioral: Negative for dysphoric mood. The patient is not nervous/anxious.     There are no problems to display for this patient.   No Known Allergies  Past Surgical History:  Procedure Laterality Date  . OVARIAN CYST REMOVAL Left 03/2015  . TONSILLECTOMY      Social History   Tobacco Use  . Smoking status: Never Smoker  . Smokeless tobacco: Never Used  Vaping Use  . Vaping Use: Never used  Substance Use Topics  . Alcohol use: Yes    Comment: occasionally  . Drug use: Never     Medication list has been reviewed and updated.  Current Meds  Medication Sig  . Ascorbic Acid (VITAMIN C) 100 MG tablet Take 200 mg by mouth daily.  . cetirizine (ZYRTEC) 10 MG tablet Take 10 mg by mouth daily. otc  . fluticasone (FLONASE) 50 MCG/ACT nasal spray Place 2 sprays into both nostrils daily.  Marland Kitchen ibuprofen (ADVIL) 600 MG tablet Take 1  tablet (600 mg total) by mouth every 6 (six) hours as needed.    PHQ 2/9 Scores 08/31/2020  PHQ - 2 Score 0  PHQ- 9 Score 0    GAD 7 : Generalized Anxiety Score 08/31/2020  Nervous, Anxious, on Edge 0  Control/stop worrying 1  Worry too much - different things 1  Trouble relaxing 0   Restless 0  Easily annoyed or irritable 0  Afraid - awful might happen 0  Total GAD 7 Score 2  Anxiety Difficulty Not difficult at all    BP Readings from Last 3 Encounters:  08/31/20 110/70  12/28/19 (!) 123/90  09/28/19 106/67    Physical Exam Vitals and nursing note reviewed.  Constitutional:      Appearance: She is well-developed.  HENT:     Head: Normocephalic.     Right Ear: Tympanic membrane, ear canal and external ear normal.     Left Ear: Tympanic membrane, ear canal and external ear normal.     Nose: Congestion and rhinorrhea present. No septal deviation.     Right Turbinates: Swollen. Not enlarged or pale.     Left Turbinates: Swollen. Not enlarged or pale.     Right Sinus: No maxillary sinus tenderness or frontal sinus tenderness.     Left Sinus: Maxillary sinus tenderness and frontal sinus tenderness present.     Mouth/Throat:     Pharynx: Oropharynx is clear. Uvula midline.  Eyes:     General: Lids are everted, no foreign bodies appreciated. No scleral icterus.       Left eye: No foreign body or hordeolum.     Conjunctiva/sclera: Conjunctivae normal.     Right eye: Right conjunctiva is not injected.     Left eye: Left conjunctiva is not injected.     Pupils: Pupils are equal, round, and reactive to light.  Neck:     Thyroid: No thyromegaly.     Vascular: No JVD.     Trachea: No tracheal deviation.  Cardiovascular:     Rate and Rhythm: Normal rate and regular rhythm.     Heart sounds: Normal heart sounds. No murmur heard. No friction rub. No gallop.   Pulmonary:     Effort: Pulmonary effort is normal. No respiratory distress.     Breath sounds: Normal breath sounds. No wheezing or rales.  Abdominal:     General: Bowel sounds are normal.     Palpations: Abdomen is soft. There is no mass.     Tenderness: There is no abdominal tenderness. There is no guarding or rebound.  Musculoskeletal:        General: No tenderness. Normal range of motion.      Cervical back: Normal range of motion and neck supple.  Lymphadenopathy:     Head:     Right side of head: No submental, submandibular or tonsillar adenopathy.     Left side of head: No submental, submandibular or tonsillar adenopathy.     Cervical: No cervical adenopathy.     Right cervical: No superficial, deep or posterior cervical adenopathy.    Left cervical: No superficial, deep or posterior cervical adenopathy.  Skin:    General: Skin is warm.     Findings: No rash.  Neurological:     Mental Status: She is alert and oriented to person, place, and time.     Cranial Nerves: No cranial nerve deficit.     Deep Tendon Reflexes: Reflexes normal.  Psychiatric:        Mood  and Affect: Mood is not anxious or depressed.     Wt Readings from Last 3 Encounters:  08/31/20 143 lb (64.9 kg)  12/28/19 145 lb (65.8 kg)  09/27/19 136 lb (61.7 kg)    BP 110/70   Pulse 76   Temp 97.7 F (36.5 C) (Oral)   Ht 5\' 7"  (1.702 m)   Wt 143 lb (64.9 kg)   LMP 08/14/2020 (Approximate)   SpO2 98%   BMI 22.40 kg/m   Assessment and Plan:  1. Establishing care with new doctor, encounter for Patient establishing care with new physician.  Patient chart was reviewed for previous encounters most recent labs most recent imaging as well as care everywhere.  2. Recurrent sinusitis New onset.  Recurrent.  Active at this time.  Patient continues to have purulent drainage nasally despite using Sudafed and nasal steroids.  Patient has had a 7-day course of Augmentin which decreases some of the drainage however this is returned to previous levels of discharge.  There is concerned that there may be a chronic component to this sinus infection and we will refer to ear nose and throat for evaluation in the meantime we repeated her Augmentin but for 10 days.  We have also suggested that she combine a mucolytic agent with decongestant such as Sudafed saline lavage and continue intranasal steroid.  Patient is to return  if she has any further concerns prior to evaluation with ear nose and throat Dr. 08/16/2020. - amoxicillin-clavulanate (AUGMENTIN) 875-125 MG tablet; Take 1 tablet by mouth 2 (two) times daily.  Dispense: 20 tablet; Refill: 0 - Ambulatory referral to ENT

## 2020-10-01 LAB — HM PAP SMEAR

## 2020-11-08 ENCOUNTER — Other Ambulatory Visit: Payer: Self-pay

## 2020-11-08 ENCOUNTER — Encounter: Payer: Self-pay | Admitting: Family Medicine

## 2020-11-08 ENCOUNTER — Ambulatory Visit (INDEPENDENT_AMBULATORY_CARE_PROVIDER_SITE_OTHER): Payer: BLUE CROSS/BLUE SHIELD | Admitting: Family Medicine

## 2020-11-08 VITALS — BP 120/60 | HR 72 | Ht 67.0 in | Wt 148.0 lb

## 2020-11-08 DIAGNOSIS — R42 Dizziness and giddiness: Secondary | ICD-10-CM

## 2020-11-08 MED ORDER — MECLIZINE HCL 25 MG PO TABS: 25.0000 mg | ORAL_TABLET | Freq: Three times a day (TID) | ORAL | 0 refills | Status: DC | PRN

## 2020-11-08 NOTE — Progress Notes (Signed)
Date:  11/08/2020   Name:  Doris Sutton   DOB:  [birth year]   MRN:  [id number]   Chief Complaint: motion sickness (Happens when riding in a car as a passenger, watching TV- has gotten worse in the past 6 months to a year) and Labs Only (Needs renal and lipid for biometric)  Dizziness This is a chronic problem. The current episode started more than 1 year ago (5 years/more frequent over last 6 months). The problem occurs intermittently. The problem has been gradually worsening. Associated symptoms include nausea and vertigo. Pertinent negatives include no abdominal pain, chest pain, chills, congestion, coughing, fever, headaches, myalgias, neck pain, rash, sore throat or visual change. Exacerbated by: passenger riding/rooler coaster/motion simulated video games. Treatments tried: stop activity takes 30 minutes. The treatment provided moderate relief.   Lab Results  Component Value Date   CREATININE 0.93 09/27/2019   BUN 10 09/27/2019   NA 140 09/27/2019   K 3.5 09/27/2019   CL 104 09/27/2019   CO2 27 09/27/2019   No results found for: CHOL, HDL, LDLCALC, LDLDIRECT, TRIG, CHOLHDL No results found for: TSH No results found for: HGBA1C Lab Results  Component Value Date   WBC 11.3 (H) 09/27/2019   HGB 13.5 09/27/2019   HCT 40.4 09/27/2019   MCV 91.2 09/27/2019   PLT 307 09/27/2019   Lab Results  Component Value Date   ALT 13 09/27/2019   AST 16 09/27/2019   ALKPHOS 45 09/27/2019   BILITOT 0.7 09/27/2019     Review of Systems  Constitutional:  Negative for chills and fever.  HENT:  Negative for congestion, drooling, ear discharge, ear pain and sore throat.   Respiratory:  Negative for cough, shortness of breath and wheezing.   Cardiovascular:  Negative for chest pain, palpitations and leg swelling.  Gastrointestinal:  Positive for nausea. Negative for abdominal pain, blood in stool, constipation and diarrhea.  Endocrine: Negative for polydipsia.  Genitourinary:  Negative  for dysuria, frequency, hematuria and urgency.  Musculoskeletal:  Negative for back pain, myalgias and neck pain.  Skin:  Negative for rash.  Allergic/Immunologic: Negative for environmental allergies.  Neurological:  Positive for dizziness and vertigo. Negative for headaches.  Hematological:  Does not bruise/bleed easily.  Psychiatric/Behavioral:  Negative for suicidal ideas. The patient is not nervous/anxious.    There are no problems to display for this patient.   No Known Allergies  Past Surgical History:  Procedure Laterality Date   OVARIAN CYST REMOVAL Left 03/2015   TONSILLECTOMY      Social History   Tobacco Use   Smoking status: Never   Smokeless tobacco: Never  Vaping Use   Vaping Use: Never used  Substance Use Topics   Alcohol use: Yes    Comment: occasionally   Drug use: Never     Medication list has been reviewed and updated.  Current Meds  Medication Sig   Ascorbic Acid (VITAMIN C) 100 MG tablet Take 200 mg by mouth daily.   cetirizine (ZYRTEC) 10 MG tablet Take 10 mg by mouth daily. otc   fluticasone (FLONASE) 50 MCG/ACT nasal spray Place 2 sprays into both nostrils daily.   ibuprofen (ADVIL) 600 MG tablet Take 1 tablet (600 mg total) by mouth every 6 (six) hours as needed.    PHQ 2/9 Scores 11/08/2020 08/31/2020  PHQ - 2 Score 0 0  PHQ- 9 Score 0 0    GAD 7 : Generalized Anxiety Score 11/08/2020 08/31/2020  Nervous, Anxious, on Edge  0 0  Control/stop worrying 0 1  Worry too much - different things 0 1  Trouble relaxing 0 0  Restless 0 0  Easily annoyed or irritable 0 0  Afraid - awful might happen 0 0  Total GAD 7 Score 0 2  Anxiety Difficulty - Not difficult at all    BP Readings from Last 3 Encounters:  11/08/20 120/60  08/31/20 110/70  12/28/19 (!) 123/90    Physical Exam Vitals and nursing note reviewed.  Constitutional:      General: She is not in acute distress.    Appearance: She is not diaphoretic.  HENT:     Head: Normocephalic  and atraumatic.     Right Ear: Tympanic membrane, ear canal and external ear normal.     Left Ear: Tympanic membrane, ear canal and external ear normal.     Nose: Nose normal.  Eyes:     General: No visual field deficit.       Right eye: No discharge.        Left eye: No discharge.     Conjunctiva/sclera: Conjunctivae normal.     Pupils: Pupils are equal, round, and reactive to light.  Neck:     Thyroid: No thyromegaly.     Vascular: No JVD.  Cardiovascular:     Rate and Rhythm: Normal rate and regular rhythm.     Heart sounds: Normal heart sounds. No murmur heard.   No friction rub. No gallop.  Pulmonary:     Effort: Pulmonary effort is normal.     Breath sounds: Normal breath sounds.  Abdominal:     General: Bowel sounds are normal.     Palpations: Abdomen is soft. There is no mass.     Tenderness: There is no abdominal tenderness. There is no guarding.  Musculoskeletal:        General: Normal range of motion.     Cervical back: Normal range of motion and neck supple.  Lymphadenopathy:     Cervical: No cervical adenopathy.  Skin:    General: Skin is warm and dry.  Neurological:     Mental Status: She is alert.     Cranial Nerves: Cranial nerves are intact. No cranial nerve deficit or facial asymmetry.     Sensory: Sensation is intact. No sensory deficit.     Motor: Motor function is intact.     Deep Tendon Reflexes: Reflexes are normal and symmetric.    Wt Readings from Last 3 Encounters:  11/08/20 148 lb (67.1 kg)  08/31/20 143 lb (64.9 kg)  12/28/19 145 lb (65.8 kg)    BP 120/60   Pulse 72   Ht 5\' 7"  (1.702 m)   Wt 148 lb (67.1 kg)   LMP 10/31/2020 (Approximate)   BMI 23.18 kg/m   Assessment and Plan:  1. Vertigo Chronic.  Episodic.  Stable.  Complicated.  Patient has always had an issue with vertigo and motion sickness but most recently it has increased in frequency not necessarily in intensity.  Under no circumstances can she be a passenger without  vertigo and nausea.  We will initiate Antivert 25 mg both in a treatment noted as well as may be a prophylactic when she is taking a trip med.  We will also give information on Epley maneuver that she may try on her own.  Patient has seen Dr. 12/31/2020 in the past for chronic sinusitis and we will refer back to him for evaluation of this persistent aggravation. - meclizine (  ANTIVERT) 25 MG tablet; Take 1 tablet (25 mg total) by mouth 3 (three) times daily as needed for dizziness.  Dispense: 30 tablet; Refill: 0 - Ambulatory referral to ENT

## 2020-11-08 NOTE — Patient Instructions (Addendum)
Vertigo Vertigo is the feeling that you or your surroundings are moving when they are not. This feeling can come and go at any time. Vertigo often goes away on its own. Vertigo can be dangerous if it occurs while you are doing something that could endanger you or others, such as driving or operating machinery. Your health care provider will do tests to determine the cause of your vertigo. Tests will also help your health care provider decide how best to treat your condition. Follow these instructions at home: Eating and drinking Drink enough fluid to keep your urine pale yellow. Do not drink alcohol.      Activity Return to your normal activities as told by your health care provider. Ask your health care provider what activities are safe for you. In the morning, first sit up on the side of the bed. When you feel okay, stand slowly while you hold onto something until you know that your balance is fine. Move slowly. Avoid sudden body or head movements or certain positions, as told by your health care provider. If you have trouble walking or keeping your balance, try using a cane for stability. If you feel dizzy or unstable, sit down right away. Avoid doing any tasks that would cause danger to you or others if vertigo occurs. Avoid bending down if you feel dizzy. Place items in your home so that they are easy for you to reach without leaning over. Do not drive or use heavy machinery if you feel dizzy. General instructions Take over-the-counter and prescription medicines only as told by your health care provider. Keep all follow-up visits as told by your health care provider. This is important. Contact a health care provider if: Your medicines do not relieve your vertigo or they make it worse. You have a fever. Your condition gets worse or you develop new symptoms. Your family or friends notice any behavioral changes. Your nausea or vomiting gets worse. You have numbness or a prickling and  tingling sensation in part of your body. Get help right away if you: Have difficulty moving or speaking. Are always dizzy. Faint. Develop severe headaches. Have weakness in your hands, arms, or legs. Have changes in your hearing or vision. Develop a stiff neck. Develop sensitivity to light. Summary Vertigo is the feeling that you or your surroundings are moving when they are not. Your health care provider will do tests to determine the cause of your vertigo. Follow instructions for home care. You may be told to avoid certain tasks, positions, or movements. Contact a health care provider if your medicines do not relieve your symptoms, or if you have a fever, nausea, vomiting, or changes in behavior. Get help right away if you have severe headaches or difficulty speaking, or you develop hearing or vision problems. This information is not intended to replace advice given to you by your health care provider. Make sure you discuss any questions you have with your health care provider. Document Revised: 04/12/2018 Document Reviewed: 04/12/2018 Elsevier Patient Education  2021 [employer] Benign Positional Vertigo Vertigo is the feeling that you or your surroundings are moving when they are not. Benign positional vertigo is the most common form of vertigo. This is usually a harmless condition (benign). This condition is positional. This means that symptoms are triggered by certain movements and positions. This condition can be dangerous if it occurs while you are doing something that could cause harm to you or others. This includes activities such as driving or operating  machinery. What are the causes? The inner ear has fluid-filled canals that help your brain sense movement and balance. When the fluid moves, the brain receives messages about your body's position. With benign positional vertigo, crystals in the inner ear break free and disturb the inner ear area. This causes your brain to receive  confusing messages about your body's position. What increases the risk? You are more likely to develop this condition if: You are a woman. You are 13 years of age or older. You have recently had a head injury. You have an inner ear disease. What are the signs or symptoms? Symptoms of this condition usually happen when you move your head or your eyes in different directions. Symptoms may start suddenly, and usually last for less than a minute. They include: Loss of balance and falling. Feeling like you are spinning or moving. Feeling like your surroundings are spinning or moving. Nausea and vomiting. Blurred vision. Dizziness. Involuntary eye movement (nystagmus). Symptoms can be mild and cause only minor problems, or they can be severe and interfere with daily life. Episodes of benign positional vertigo may return (recur) over time. Symptoms may improve over time. How is this diagnosed? This condition may be diagnosed based on: Your medical history. Physical exam of the head, neck, and ears. Positional tests to check for or stimulate vertigo. You may be asked to turn your head and change positions, such as going from sitting to lying down. A health care provider will watch for symptoms of vertigo. You may be referred to a health care provider who specializes in ear, nose, and throat problems (ENT, or otolaryngologist) or a provider who specializes in disorders of the nervous system (neurologist). How is this treated? This condition may be treated in a session in which your health care provider moves your head in specific positions to help the displaced crystals in your inner ear move. Treatment for this condition may take several sessions. Surgery may be needed in severe cases, but this is rare. In some cases, benign positional vertigo may resolve on its own in 2-4 weeks.   Follow these instructions at home: Safety Move slowly. Avoid sudden body or head movements or certain positions, as  told by your health care provider. Avoid driving until your health care provider says it is safe for you to do so. Avoid operating heavy machinery until your health care provider says it is safe for you to do so. Avoid doing any tasks that would be dangerous to you or others if vertigo occurs. If you have trouble walking or keeping your balance, try using a cane for stability. If you feel dizzy or unstable, sit down right away. Return to your normal activities as told by your health care provider. Ask your health care provider what activities are safe for you. General instructions Take over-the-counter and prescription medicines only as told by your health care provider. Drink enough fluid to keep your urine pale yellow. Keep all follow-up visits as told by your health care provider. This is important. Contact a health care provider if: You have a fever. Your condition gets worse or you develop new symptoms. Your family or friends notice any behavioral changes. You have nausea or vomiting that gets worse. You have numbness or a prickling and tingling sensation. Get help right away if you: Have difficulty speaking or moving. Are always dizzy. Faint. Develop severe headaches. Have weakness in your legs or arms. Have changes in your hearing or vision. Develop a stiff neck.  Develop sensitivity to light. Summary Vertigo is the feeling that you or your surroundings are moving when they are not. Benign positional vertigo is the most common form of vertigo. This condition is caused by crystals in the inner ear that become displaced. This causes a disturbance in an area of the inner ear that helps your brain sense movement and balance. Symptoms include loss of balance and falling, feeling that you or your surroundings are moving, nausea and vomiting, and blurred vision. This condition can be diagnosed based on symptoms, a physical exam, and positional tests. Follow safety instructions as told by  your health care provider. You will also be told when to contact your health care provider in case of problems. This information is not intended to replace advice given to you by your health care provider. Make sure you discuss any questions you have with your health care provider. Document Revised: 04/12/2019 Document Reviewed: 10/28/2017 Elsevier Patient Education  2021 Elsevier Inc. How to Perform the Epley Maneuver The Epley maneuver is an exercise that relieves symptoms of vertigo. Vertigo is the feeling that you or your surroundings are moving when they are not. When you feel vertigo, you may feel like the room is spinning and may have trouble walking. The Epley maneuver is used for a type of vertigo caused by a calcium deposit in a part of the inner ear. The maneuver involves changing head positions to help the deposit move out of the area. You can do this maneuver at home whenever you have symptoms of vertigo. You can repeat it in 24 hours if your vertigo has not gone away. Even though the Epley maneuver may relieve your vertigo for a few weeks, it is possible that your symptoms will return. This maneuver relieves vertigo, but it does not relieve dizziness. What are the risks? If it is done correctly, the Epley maneuver is considered safe. Sometimes it can lead to dizziness or nausea that goes away after a short time. If you develop other symptoms--such as changes in vision, weakness, or numbness--stop doing the maneuver and call your health care provider. Supplies needed: A bed or table. A pillow. How to do the Epley maneuver Sit on the edge of a bed or table with your back straight and your legs extended or hanging over the edge of the bed or table. Turn your head halfway toward the affected ear or side as told by your health care provider. Lie backward quickly with your head turned until you are lying flat on your back. You may want to position a pillow under your shoulders. Hold this  position for at least 30 seconds. If you feel dizzy or have symptoms of vertigo, continue to hold the position until the symptoms stop. Turn your head to the opposite direction until your unaffected ear is facing the floor. Hold this position for at least 30 seconds. If you feel dizzy or have symptoms of vertigo, continue to hold the position until the symptoms stop. Turn your whole body to the same side as your head so that you are positioned on your side. Your head will now be nearly facedown. Hold for at least 30 seconds. If you feel dizzy or have symptoms of vertigo, continue to hold the position until the symptoms stop. Sit back up. You can repeat the maneuver in 24 hours if your vertigo does not go away.      Follow these instructions at home: For 24 hours after doing the Epley maneuver: Keep your  head in an upright position. When lying down to sleep or rest, keep your head raised (elevated) with two or more pillows. Avoid excessive neck movements. Activity Do not drive or use machinery if you feel dizzy. After doing the Epley maneuver, return to your normal activities as told by your health care provider. Ask your health care provider what activities are safe for you. General instructions Drink enough fluid to keep your urine pale yellow. Do not drink alcohol. Take over-the-counter and prescription medicines only as told by your health care provider. Keep all follow-up visits as told by your health care provider. This is important. Preventing vertigo symptoms Ask your health care provider if there is anything you should do at home to prevent vertigo. He or she may recommend that you: Keep your head elevated with two or more pillows while you sleep. Do not sleep on the side of your affected ear. Get up slowly from bed. Avoid sudden movements during the day. Avoid extreme head positions or movement, such as looking up or bending over. Contact a health care provider if: Your vertigo  gets worse. You have other symptoms, including: Nausea. Vomiting. Headache. Get help right away if you: Have vision changes. Have a headache or neck pain that is severe or getting worse. Cannot stop vomiting. Have new numbness or weakness in any part of your body. Summary Vertigo is the feeling that you or your surroundings are moving when they are not. The Epley maneuver is an exercise that relieves symptoms of vertigo. If the Epley maneuver is done correctly, it is considered safe and relieves vertigo quickly. This information is not intended to replace advice given to you by your health care provider. Make sure you discuss any questions you have with your health care provider. Document Revised: 03/16/2019 Document Reviewed: 03/16/2019 Elsevier Patient Education  2021 [employer].

## 2021-01-10 ENCOUNTER — Ambulatory Visit
Admission: EM | Admit: 2021-01-10 | Discharge: 2021-01-10 | Disposition: A | Discharge status: Home / Self Care | Payer: BLUE CROSS/BLUE SHIELD | Attending: Emergency Medicine | Admitting: Emergency Medicine

## 2021-01-10 ENCOUNTER — Other Ambulatory Visit: Payer: Self-pay

## 2021-01-10 ENCOUNTER — Encounter: Payer: Self-pay | Admitting: Emergency Medicine

## 2021-01-10 DIAGNOSIS — B349 Viral infection, unspecified: Secondary | ICD-10-CM

## 2021-01-10 NOTE — Discharge Instructions (Addendum)
We will call you with any positive results from your COVID-19/Influenza testing completed in clinic today.  If you do not receive a phone call from us within the next 2-3 days, check your MyChart for up-to-date health information related to testing completed in clinic today.   For most people this is a self-limiting process and can take anywhere from 7 - 10 days to start feeling better. A cough can last up to 3 weeks. Pay special attention to handwashing as this can help prevent the spread of the virus.   Always read the labels of cough and cold medications as they may contain some of the ingredients below.  Rest, push lots of fluids (especially water), and utilize supportive care for symptoms. You may take acetaminophen (Tylenol) every 4-6 hours and ibuprofen every 6-8 hours for muscle pain, joint pain, headaches (you may also alternate these medications). Mucinex (guaifenesin) may be taken over the counter for cough as needed can loosen phlegm. Please read the instructions and take as directed.  Sudafed (pseudophedrine) is sold behind the counter and can help reduce nasal pressure; avoid taking this if you have high blood pressure or feel jittery. Sudafed PE (phenylephrine) can be a helpful, short-term, over-the-counter alternative to limit side effects or if you have high blood pressure.  Flonase nasal spray can help alleviate congestion and sinus pressure. Many patients choose Afrin as a nasal decongestant; do not use for more than 3 days for risk of rebound (increased symptoms after stopping medication).  Saline nasal sprays or rinses can also help nasal congestion (use bottled or sterile water). Warm tea with lemon and honey can sooth sore throat and cough, as can cough drops.   Return to clinic for high fever not improving with medications, chest pain, difficulty breathing, non-stop vomiting, or coughing blood. Follow-up with your primary care provider if symptoms do not improve as expected in  the next 5-7 days.  

## 2021-01-10 NOTE — ED Triage Notes (Signed)
Pt presents today with c/o of sore throat, chills and nasal congest "post nasal drip" x 2 days. Home Covid test taken yesterday; neg.

## 2021-01-10 NOTE — ED Provider Notes (Signed)
CHIEF COMPLAINT:   Chief Complaint  Patient presents with   Chills   Sore Throat   Nasal Congestion     SUBJECTIVE/HPI:   Sore Throat  A very pleasant 30 y.o.Female presents today with chills, sore throat, nasal congestion and postnasal drip for the last 2 days.  Patient reports on Tuesday she began having a sore throat.  Patient reports intermittent episodes of sweating.  She reports hot and cold sweats.  She states that she took Tylenol on Wednesday and states that she took a warm shower which did not help.  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  Patient states that she has not had COVID-19.  Patient reports negative COVID-19 test at home.  She reports no additional over-the-counter medications used.  She also does not report any use of antihistamines. Patient does not report any shortness of breath, chest pain, palpitations, visual changes, weakness, tingling, headache, nausea, vomiting, diarrhea.   has a past medical history of Allergy.  ROS:  Review of Systems See Subjective/HPI Medications, Allergies and Problem List personally reviewed in Epic today OBJECTIVE:   Vitals:   01/10/21 1040  BP: 115/75  Pulse: 85  Resp: 16  Temp: 98.1 F (36.7 C)  SpO2: 97%    Physical Exam   General: Appears well-developed and well-nourished. No acute distress.  HEENT Head: Normocephalic and atraumatic.   Ears: Hearing grossly intact, no drainage or visible deformity.  Nose: No nasal deviation.   Mouth/Throat: No stridor or tracheal deviation.  Non erythematous posterior pharynx noted with clear drainage present.  No white patchy exudate noted. Eyes: Conjunctivae and EOM are normal. No eye drainage or scleral icterus bilaterally.  Neck: Normal range of motion, neck is supple. No cervical, tonsillar or submandibular lymph nodes palpated.   Cardiovascular: Normal rate. Regular rhythm; no murmurs, gallops, or rubs.   Pulm/Chest: No respiratory distress. Breath sounds normal bilaterally without wheezes, rhonchi, or rales.  Neurological: Alert and oriented to person, place, and time.  Skin: Skin is warm and dry.  No rashes, lesions, abrasions or bruising noted to skin.   Psychiatric: Normal mood, affect, behavior, and thought content.   Vital signs and nursing note reviewed.   Patient stable and cooperative with examination. PROCEDURES:    LABS/X-RAYS/EKG/MEDS:   No results found for any visits on 01/10/21.  MEDICAL DECISION MAKING:   Patient presents with chills, sore throat, nasal congestion and postnasal drip for the last 2 days.  Patient reports on Tuesday she began having a sore throat.  Patient reports intermittent episodes of sweating.  She reports hot and cold sweats.  She states that she took Tylenol on Wednesday and states that she took a warm shower which did not help.  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  Patient states that she has not had COVID-19.  Patient reports negative COVID-19 test at home.  She reports no additional over-the-counter medications used.  She also does not report any use of antihistamines. Patient does not report any shortness of breath, chest pain, palpitations, visual changes, weakness, tingling, headache, nausea, vomiting, diarrhea.  Chart review completed.  COVID-19 PCR/influenza obtained in clinic today.  Work note provided.  Given symptoms along with assessment findings, likely viral illness.  Advised about home treatment and care as outlined in her AVS to include rest, Tylenol versus ibuprofen, Mucinex.  Advised that  we would call with any positive results and negative results would upload directly to her MyChart account.  Explained strict return precautions for worsening of symptoms to include uncontrolled fever, dizziness, chest pain, shortness of breath.  Return as needed.  Patient verbalized  understanding and agreed with treatment plan.  Patient stable upon discharge. ASSESSMENT/PLAN:  1. Viral illness - Covid-19, Flu A+B ([facility]); Standing - Covid-19, Flu A+B ([facility])   Plan:   Discharge Instructions      We will call you with any positive results from your COVID-19/Influenza testing completed in clinic today.  If you do not receive a phone call from [place] within the next 2-3 days, check your MyChart for up-to-date health information related to testing completed in clinic today.  For most people this is a self-limiting process and can take anywhere from 7 - 10 days to start feeling better. A cough can last up to 3 weeks. Pay special attention to handwashing as this can help prevent the spread of the virus.   Always read the labels of cough and cold medications as they may contain some of the ingredients below.  Rest, push lots of fluids (especially water), and utilize supportive care for symptoms. You may take acetaminophen (Tylenol) every 4-6 hours and ibuprofen every 6-8 hours for muscle pain, joint pain, headaches (you may also alternate these medications). Mucinex (guaifenesin) may be taken over the counter for cough as needed can loosen phlegm. Please read the instructions and take as directed.  Sudafed (pseudophedrine) is sold behind the counter and can help reduce nasal pressure; avoid taking this if you have high blood pressure or feel jittery. Sudafed PE (phenylephrine) can be a helpful, short-term, over-the-counter alternative to limit side effects or if you have high blood pressure.  Flonase nasal spray can help alleviate congestion and sinus pressure. Many patients choose Afrin as a nasal decongestant; do not use for more than 3 days for risk of rebound (increased symptoms after stopping medication).  Saline nasal sprays or rinses can also help nasal congestion (use bottled or sterile water). Warm tea with lemon and honey can sooth sore throat and cough, as can  cough drops.   Return to clinic for high fever not improving with medications, chest pain, difficulty breathing, non-stop vomiting, or coughing blood. Follow-up with your primary care provider if symptoms do not improve as expected in the next 5-7 days.          Amalia Greenhouse, FNP 01/10/21 1140

## 2021-01-12 ENCOUNTER — Encounter: Payer: Self-pay | Admitting: Family Medicine

## 2021-01-12 LAB — COVID-19, FLU A+B NAA
Influenza A, NAA: NOT DETECTED
Influenza B, NAA: NOT DETECTED
SARS-CoV-2, NAA: NOT DETECTED

## 2021-01-14 ENCOUNTER — Telehealth: Payer: Self-pay | Admitting: Emergency Medicine

## 2021-01-14 DIAGNOSIS — R059 Cough, unspecified: Secondary | ICD-10-CM

## 2021-01-14 MED ORDER — PROMETHAZINE-DM 6.25-15 MG/5ML PO SYRP: 5.0000 mL | ORAL_SOLUTION | Freq: Four times a day (QID) | ORAL | 0 refills | Status: AC | PRN

## 2021-01-14 NOTE — Telephone Encounter (Signed)
Patient reports continued coughing after testing negative for COVID-19.  Patient is requesting cough syrup.  Rx'd Phenergan cough syrup to the patient's preferred pharmacy.  Did advise that if she has any continued symptoms or concerns that she will have to be reevaluated.  Patient verbalized understanding and agreed with plan.

## 2021-04-25 IMAGING — US US PELVIS COMPLETE WITH TRANSVAGINAL
1 series · 14 of 25 positions shown · non-contrast
Comparison: None

CLINICAL DATA: Miscarriage week ago continued pelvic pain



[Series 1: us pelvic complete with transvaginal · 14 of 96 slices shown]
[im 1/96]
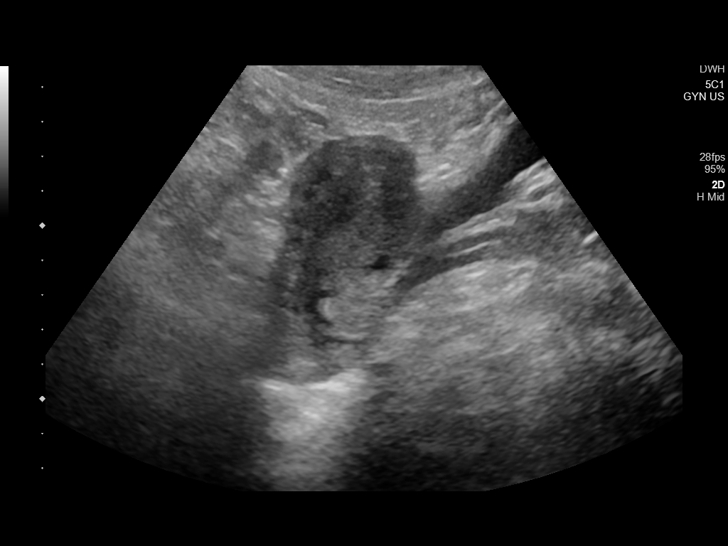
[im 8/96]
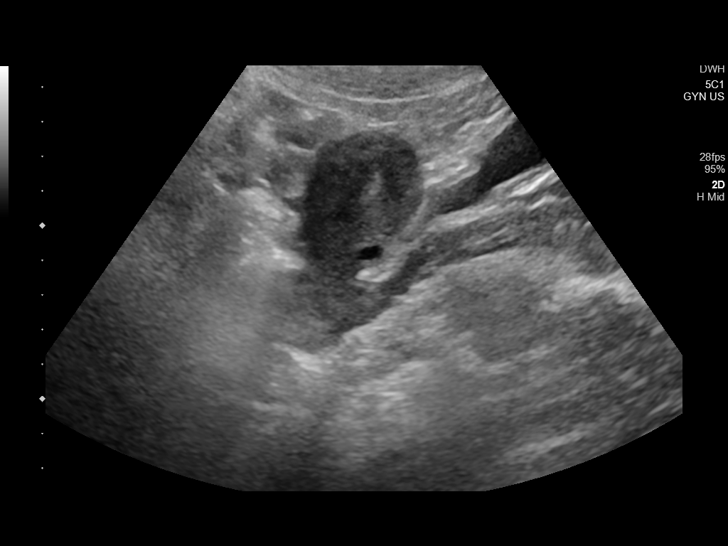
[im 16/96]
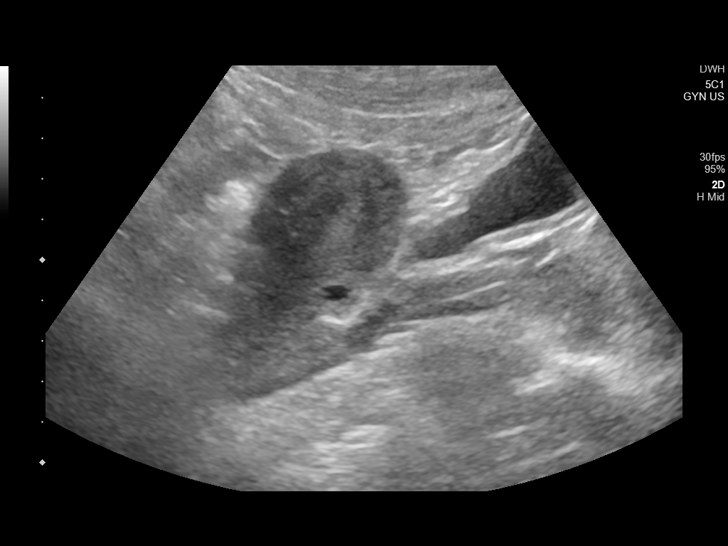
[im 24/96]
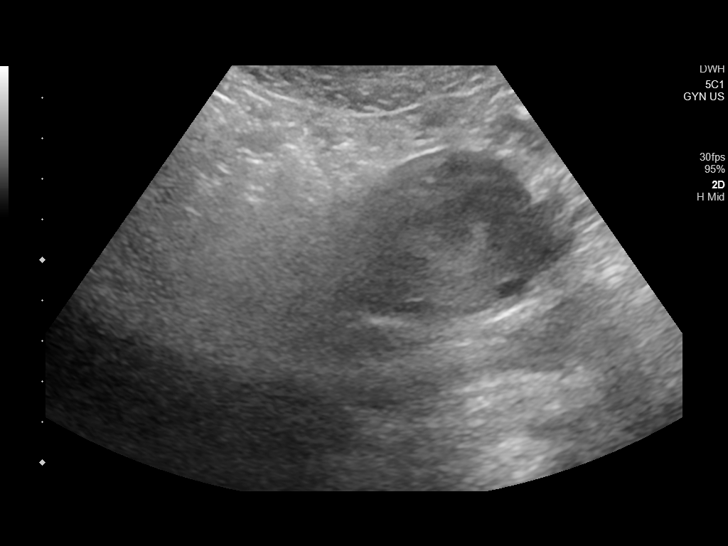
[im 32/96]
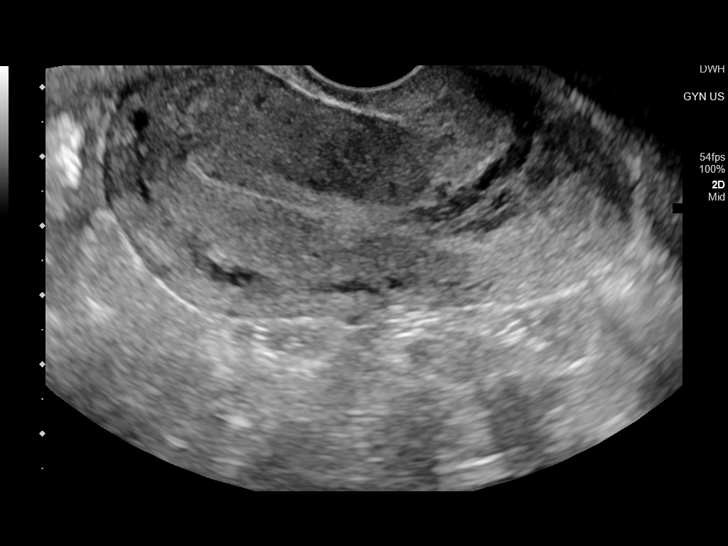
[im 36/96]
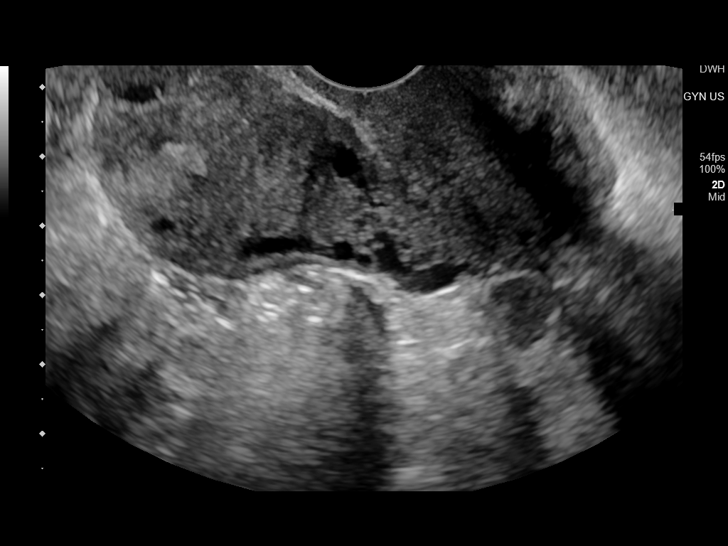
[im 44/96]
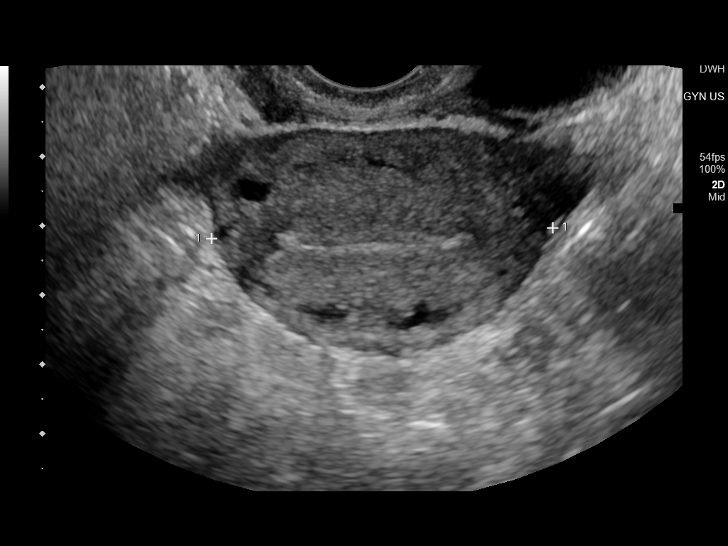
[im 52/96]
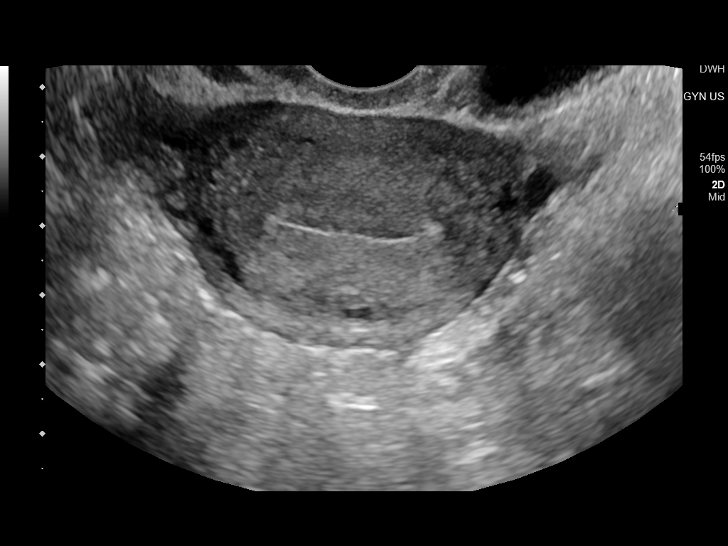
[im 60/96]
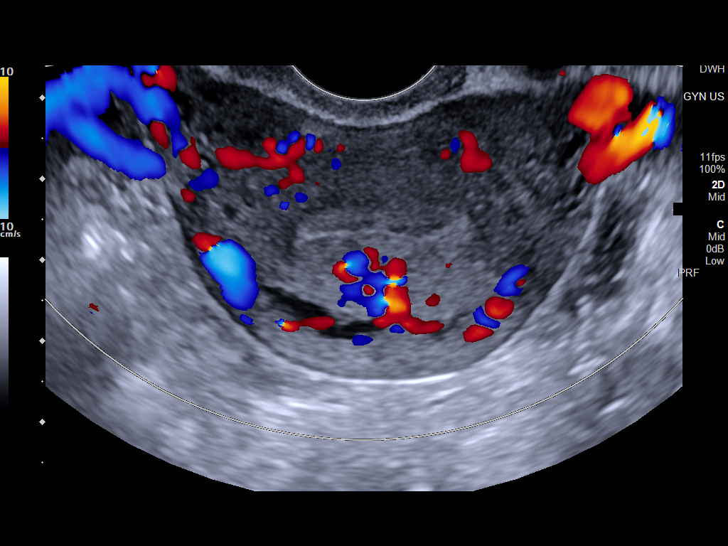
[im 64/96]
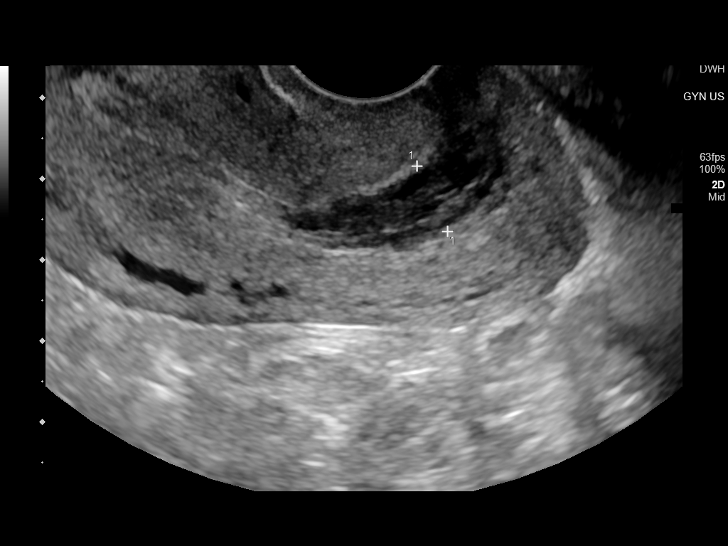
[im 72/96]
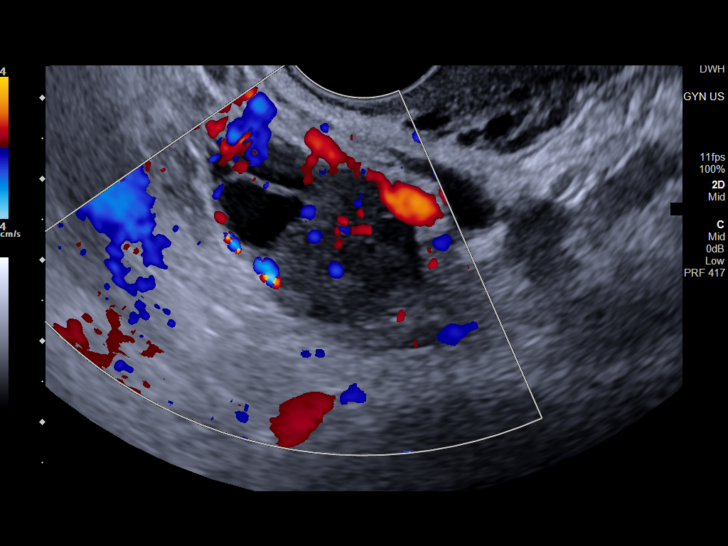
[im 80/96]
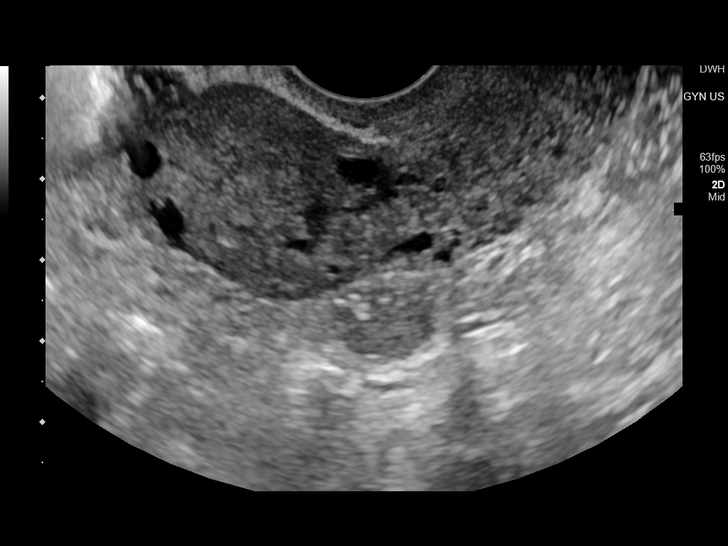
[im 88/96]
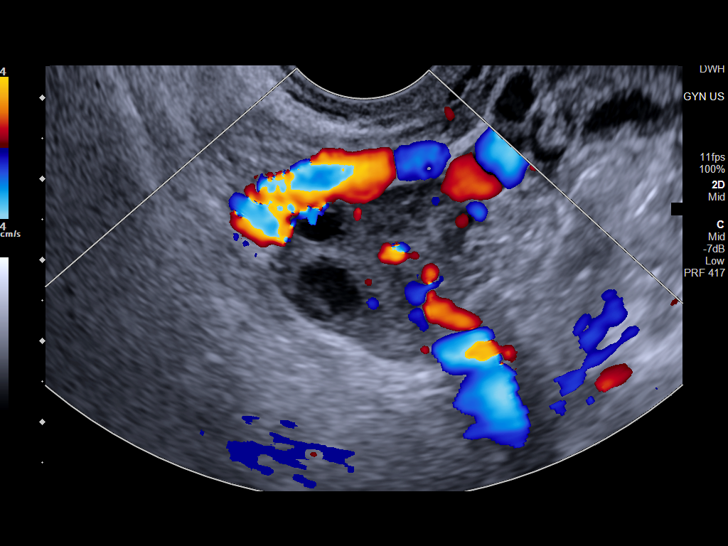
[im 96/96]
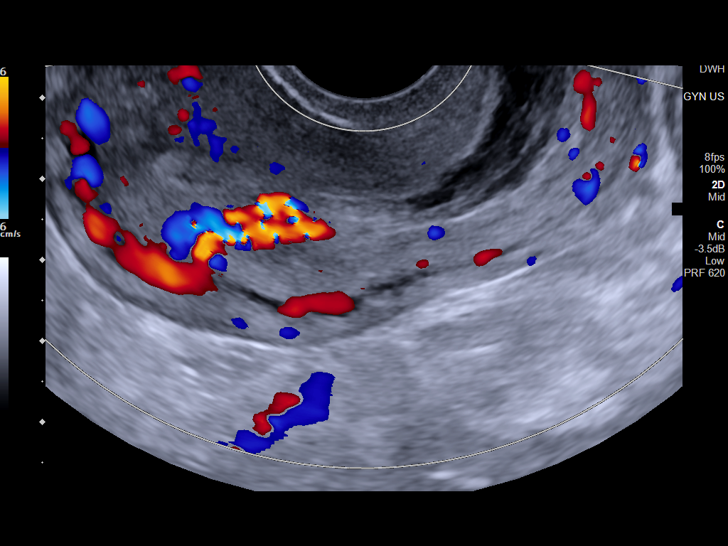

[14 of 25 positions shown; findings below may reference images not displayed]

FINDINGS: Uterus

Measurements: 5.2x1.5 x 4.9 cm = volume: 66 mL. No fibroids or other
mass visualized.

Endometrium

Thickness: 6.1 mm. Heterogeneous areas of increased vascularity seen
within the endometrium.

Right ovary

Measurements: 2.9 x 1.9 x 3.4 cm = volume: 10 mL. Normal
appearance/no adnexal mass.

Left ovary

Measurements: 3.2 x 2.2 x 2.0 cm = volume: 7.5 mL. Normal
appearance/no adnexal mass.

Other findings

No abnormal free fluid.
IMPRESSION: Findings which could be suggestive small area of retained products
of conception.

## 2021-05-24 ENCOUNTER — Telehealth: Payer: Self-pay

## 2021-05-24 ENCOUNTER — Ambulatory Visit: Payer: BLUE CROSS/BLUE SHIELD | Admitting: Family Medicine

## 2021-05-24 NOTE — Telephone Encounter (Signed)
Patient was put on by [facility] today for appt for migraine and nausea. Patient has not history of migraines. Patient walked in unable to hold her head up, and having visual issues.   Delice Bison spoke with patient and informed her due to her symptoms she needs to go to the ER now.   Patient was scheduled for 2 PM appointment at 1:51 PM - 9 minutes before the appt was made.  Wanted to route for reference. Thank you.

## 2021-05-28 ENCOUNTER — Telehealth: Payer: Self-pay

## 2021-05-28 NOTE — Telephone Encounter (Signed)
Patient walked in on 05/24/21 and sat down with head down in hands. She also had a rag on her forehead. I called her up to the counter and asked what was going on with the headache. Patient stated she felt horrible and the light hurt her to look up and talk. She had someone else drive her here for the appt. I asked if this was the worst headache she had ever had and explained that I didn't see any history of headaches in her chart. She said yes to the worst headache. She does not have a history of headaches. I advised her to go to ER for further evaluation. She was hesitant to go, but agreed and was provided the address to the ER at [facility]. This note has been created today as a result of seeing the ER note on patient and realizing that the note I put in on her the day of, did not save.

## 2021-06-10 ENCOUNTER — Other Ambulatory Visit: Payer: Self-pay

## 2021-06-10 ENCOUNTER — Ambulatory Visit (INDEPENDENT_AMBULATORY_CARE_PROVIDER_SITE_OTHER): Payer: BLUE CROSS/BLUE SHIELD | Admitting: Family Medicine

## 2021-06-10 ENCOUNTER — Encounter: Payer: Self-pay | Admitting: Family Medicine

## 2021-06-10 VITALS — BP 110/64 | HR 68 | Ht 67.0 in | Wt 158.0 lb

## 2021-06-10 DIAGNOSIS — G43119 Migraine with aura, intractable, without status migrainosus: Secondary | ICD-10-CM | POA: Diagnosis not present

## 2021-06-10 MED ORDER — SUMATRIPTAN SUCCINATE 25 MG PO TABS: 25.0000 mg | ORAL_TABLET | ORAL | 0 refills | Status: DC | PRN

## 2021-06-10 NOTE — Progress Notes (Signed)
Date:  06/10/2021   Name:  Doris MeekerKelly Sutton   DOB:  01-14-1991   MRN:  [id number]   Chief Complaint: Migraine  Migraine  This is a new problem. Episode onset: 2 weeks ago/followup. The problem has been resolved. The pain is located in the Frontal and bilateral region. The pain does not radiate. The pain quality is not similar to prior headaches. The quality of the pain is described as sharp and throbbing. The pain is at a severity of 9/10. The pain is severe. Associated symptoms include blurred vision, dizziness, a loss of balance, nausea, photophobia, tingling, a visual change and vomiting. Pertinent negatives include no numbness, rhinorrhea, scalp tenderness or weakness. She has tried nothing for the symptoms. The treatment provided moderate relief. There is no history of hypertension, migraine headaches or recent head traumas.   Lab Results  Component Value Date   NA 140 09/27/2019   K 3.5 09/27/2019   CO2 27 09/27/2019   GLUCOSE 91 09/27/2019   BUN 10 09/27/2019   CREATININE 0.93 09/27/2019   CALCIUM 9.3 09/27/2019   GFRNONAA >60 09/27/2019   No results found for: CHOL, HDL, LDLCALC, LDLDIRECT, TRIG, CHOLHDL No results found for: TSH No results found for: HGBA1C Lab Results  Component Value Date   WBC 11.3 (H) 09/27/2019   HGB 13.5 09/27/2019   HCT 40.4 09/27/2019   MCV 91.2 09/27/2019   PLT 307 09/27/2019   Lab Results  Component Value Date   ALT 13 09/27/2019   AST 16 09/27/2019   ALKPHOS 45 09/27/2019   BILITOT 0.7 09/27/2019   No results found for: 25OHVITD2, 25OHVITD3, VD25OH   Review of Systems  HENT:  Negative for rhinorrhea.   Eyes:  Positive for blurred vision and photophobia.  Gastrointestinal:  Positive for nausea and vomiting.  Neurological:  Positive for dizziness, tingling, headaches and loss of balance. Negative for tremors, syncope, facial asymmetry, speech difficulty, weakness, light-headedness and numbness.       Sharp episodic frontal every other  day   There are no problems to display for this patient.   Allergies  Allergen Reactions   Cheese     Aged cheese-GI issues   Citric Acid     ABD pain/GI issues    Past Surgical History:  Procedure Laterality Date   OVARIAN CYST REMOVAL Left 03/2015   TONSILLECTOMY      Social History   Tobacco Use   Smoking status: Never   Smokeless tobacco: Never  Vaping Use   Vaping Use: Never used  Substance Use Topics   Alcohol use: Yes    Comment: occasionally   Drug use: Never     Medication list has been reviewed and updated.  No outpatient medications have been marked as taking for the 06/10/21 encounter (Office Visit) with Duanne LimerickJones, Jacqulene Huntley C, MD.    [facility]HQ 2/9 Scores 11/08/2020 08/31/2020  PHQ - 2 Score 0 0  PHQ- 9 Score 0 0    GAD 7 : Generalized Anxiety Score 11/08/2020 08/31/2020  Nervous, Anxious, on Edge 0 0  Control/stop worrying 0 1  Worry too much - different things 0 1  Trouble relaxing 0 0  Restless 0 0  Easily annoyed or irritable 0 0  Afraid - awful might happen 0 0  Total GAD 7 Score 0 2  Anxiety Difficulty - Not difficult at all    BP Readings from Last 3 Encounters:  01/10/21 115/75  11/08/20 120/60  08/31/20 110/70    Physical  Exam Vitals and nursing note reviewed.  Constitutional:      Appearance: She is well-developed.  HENT:     Head: Normocephalic.     Right Ear: Tympanic membrane and external ear normal.     Left Ear: Tympanic membrane and external ear normal.     Nose: No congestion.  Eyes:     General: Lids are everted, no foreign bodies appreciated. No scleral icterus.       Left eye: No foreign body or hordeolum.     Conjunctiva/sclera: Conjunctivae normal.     Right eye: Right conjunctiva is not injected.     Left eye: Left conjunctiva is not injected.     Pupils: Pupils are equal, round, and reactive to light.  Neck:     Thyroid: No thyromegaly.     Vascular: No JVD.     Trachea: No tracheal deviation.  Cardiovascular:     Rate and  Rhythm: Normal rate and regular rhythm.     Heart sounds: Normal heart sounds. No murmur heard.   No friction rub. No gallop.  Pulmonary:     Effort: Pulmonary effort is normal. No respiratory distress.     Breath sounds: Normal breath sounds. No wheezing or rales.  Abdominal:     General: Bowel sounds are normal.     Palpations: Abdomen is soft. There is no mass.     Tenderness: There is no abdominal tenderness. There is no guarding or rebound.  Musculoskeletal:        General: No tenderness. Normal range of motion.     Cervical back: Normal range of motion and neck supple.  Lymphadenopathy:     Cervical: No cervical adenopathy.  Skin:    General: Skin is warm.     Findings: No rash.  Neurological:     Mental Status: She is alert and oriented to person, place, and time.     Cranial Nerves: Cranial nerves 2-12 are intact. No cranial nerve deficit or facial asymmetry.     Sensory: Sensation is intact. No sensory deficit.     Motor: Motor function is intact. No weakness.     Deep Tendon Reflexes: Reflexes normal.     Reflex Scores:      Tricep reflexes are 2+ on the right side and 2+ on the left side.      Bicep reflexes are 2+ on the right side and 2+ on the left side.      Brachioradialis reflexes are 2+ on the right side and 2+ on the left side.      Patellar reflexes are 2+ on the right side and 2+ on the left side. Psychiatric:        Mood and Affect: Mood is not anxious or depressed.    Wt Readings from Last 3 Encounters:  11/08/20 148 lb (67.1 kg)  08/31/20 143 lb (64.9 kg)  12/28/19 145 lb (65.8 kg)    Ht 5\' 7"  (1.702 m)    BMI 23.18 kg/m   Assessment and Plan:  1. Intractable migraine with aura without status migrainosus This was the occurrence from 2 weeks ago.  Patient had the first onset of a migraine requiring IV hydration, migraine cocktail, CT evaluation, and nausea/emesis prophylaxis.  This was the first such episode which necessitated an ER visit at which  time they were able to get the headache under control with above measures.  Given that this is her first at 30 years we will refer to neurology for evaluation  and in the meantime I will give her some Imitrex to have on hand in case she experiences an aura/headache that she can have something on board to see if it has from a diagnostic standpoint as well as therapeutic standpoint a relief.  Patient is to go to the ER if pain is as intense and unrelenting and not relieved by the first Imitrex. - Ambulatory referral to Neurology - SUMAtriptan (IMITREX) 25 MG tablet; Take 1 tablet (25 mg total) by mouth every 2 (two) hours as needed for migraine (may repeat 1 time). May repeat in 2 hours if headache persists or recurs.  Dispense: 10 tablet; Refill: 0

## 2021-06-13 ENCOUNTER — Encounter: Payer: Self-pay | Admitting: Family Medicine

## 2021-08-28 ENCOUNTER — Other Ambulatory Visit: Payer: Self-pay | Admitting: Family Medicine

## 2021-08-28 DIAGNOSIS — G43119 Migraine with aura, intractable, without status migrainosus: Secondary | ICD-10-CM

## 2021-09-12 LAB — OB RESULTS CONSOLE GC/CHLAMYDIA
Chlamydia: NEGATIVE
Neisseria Gonorrhea: NEGATIVE

## 2021-09-26 LAB — OB RESULTS CONSOLE RPR: RPR: NONREACTIVE

## 2021-09-26 LAB — OB RESULTS CONSOLE VARICELLA ZOSTER ANTIBODY, IGG: Varicella: IMMUNE

## 2021-09-27 DIAGNOSIS — G43109 Migraine with aura, not intractable, without status migrainosus: Secondary | ICD-10-CM | POA: Insufficient documentation

## 2021-09-27 DIAGNOSIS — N83201 Unspecified ovarian cyst, right side: Secondary | ICD-10-CM | POA: Insufficient documentation

## 2021-11-25 DIAGNOSIS — R87619 Unspecified abnormal cytological findings in specimens from cervix uteri: Secondary | ICD-10-CM | POA: Insufficient documentation

## 2022-02-06 ENCOUNTER — Other Ambulatory Visit: Payer: Self-pay

## 2022-02-06 DIAGNOSIS — R599 Enlarged lymph nodes, unspecified: Secondary | ICD-10-CM

## 2022-02-11 ENCOUNTER — Ambulatory Visit
Admission: RE | Admit: 2022-02-11 | Discharge: 2022-02-11 | Disposition: A | Discharge status: Home / Self Care | Payer: BLUE CROSS/BLUE SHIELD | Source: Ambulatory Visit

## 2022-02-11 DIAGNOSIS — R599 Enlarged lymph nodes, unspecified: Secondary | ICD-10-CM | POA: Diagnosis present

## 2022-03-21 ENCOUNTER — Other Ambulatory Visit: Payer: Self-pay

## 2022-03-21 ENCOUNTER — Observation Stay
Admission: EM | Admit: 2022-03-21 | Discharge: 2022-03-21 | Disposition: A | Discharge status: Home / Self Care | Payer: BLUE CROSS/BLUE SHIELD | Attending: Obstetrics and Gynecology | Admitting: Obstetrics and Gynecology

## 2022-03-21 DIAGNOSIS — R030 Elevated blood-pressure reading, without diagnosis of hypertension: Secondary | ICD-10-CM | POA: Diagnosis present

## 2022-03-21 DIAGNOSIS — O0993 Supervision of high risk pregnancy, unspecified, third trimester: Secondary | ICD-10-CM | POA: Diagnosis not present

## 2022-03-21 DIAGNOSIS — O163 Unspecified maternal hypertension, third trimester: Principal | ICD-10-CM | POA: Insufficient documentation

## 2022-03-21 DIAGNOSIS — Z3A34 34 weeks gestation of pregnancy: Secondary | ICD-10-CM | POA: Diagnosis not present

## 2022-03-21 DIAGNOSIS — Z79899 Other long term (current) drug therapy: Secondary | ICD-10-CM | POA: Insufficient documentation

## 2022-03-21 DIAGNOSIS — Z3490 Encounter for supervision of normal pregnancy, unspecified, unspecified trimester: Secondary | ICD-10-CM

## 2022-03-21 LAB — COMPREHENSIVE METABOLIC PANEL
ALT: 18 U/L (ref 0–44)
AST: 24 U/L (ref 15–41)
Albumin: 3.3 g/dL — ABNORMAL LOW (ref 3.5–5.0)
Alkaline Phosphatase: 100 U/L (ref 38–126)
Anion gap: 9 (ref 5–15)
BUN: 7 mg/dL (ref 6–20)
CO2: 20 mmol/L — ABNORMAL LOW (ref 22–32)
Calcium: 9.3 mg/dL (ref 8.9–10.3)
Chloride: 108 mmol/L (ref 98–111)
Creatinine, Ser: 0.48 mg/dL (ref 0.44–1.00)
GFR, Estimated: >60 mL/min (ref >60)
Glucose, Bld: 86 mg/dL (ref 70–99)
Potassium: 3.6 mmol/L (ref 3.5–5.1)
Sodium: 137 mmol/L (ref 135–145)
Total Bilirubin: 0.5 mg/dL (ref 0.3–1.2)
Total Protein: 6.7 g/dL (ref 6.5–8.1)

## 2022-03-21 LAB — CBC
HCT: 39.1 % (ref 36.0–46.0)
Hemoglobin: 13.2 g/dL (ref 12.0–15.0)
MCH: 29.6 pg (ref 26.0–34.0)
MCHC: 33.8 g/dL (ref 30.0–36.0)
MCV: 87.7 fL (ref 80.0–100.0)
Platelets: 253 10*3/uL (ref 150–400)
RBC: 4.46 MIL/uL (ref 3.87–5.11)
RDW: 13 % (ref 11.5–15.5)
WBC: 9.4 10*3/uL (ref 4.0–10.5)
nRBC: 0 % (ref 0.0–0.2)

## 2022-03-21 LAB — PROTEIN / CREATININE RATIO, URINE
Creatinine, Urine: 69 mg/dL
Protein Creatinine Ratio: 0.09 mg/mg{Cre} (ref 0.00–0.15)
Total Protein, Urine: 6 mg/dL

## 2022-03-21 NOTE — Discharge Summary (Signed)
Doris Sutton is a 31 y.o. female. She is at [redacted]w[redacted]d gestation. Patient's last menstrual period was 07/21/2021. Estimated Date of Delivery: 04/27/22  Prenatal care site: [facility]   Current pregnancy complicated by:  MIgraines, followed by [facility] neuro Parvo exposure: seen by MFM, followed with serial [place] until 28wks for risk of hydrops.  Hx Miscarriage 09/2019; more anxious this pregnancy  Chief complaint: sent from office for mild range BP noted in clinic.  - Denies: HA, visual changes, SOB, or RUQ/epigastric pain   S: Resting comfortably. no CTX, no VB.no LOF,  Active fetal movement.    Maternal Medical History:   Past Medical History:  Diagnosis Date   Allergy     Past Surgical History:  Procedure Laterality Date   OVARIAN CYST REMOVAL Left 03/2015   TONSILLECTOMY      Allergies  Allergen Reactions   Cheese     Aged cheese-GI issues   Citric Acid     ABD pain/GI issues    Prior to Admission medications   Medication Sig Start Date End Date Taking? Authorizing Provider  Ascorbic Acid (VITAMIN C) 100 MG tablet Take 200 mg by mouth daily.    [provider]  fluticasone (FLONASE) 50 MCG/ACT nasal spray Place 2 sprays into both nostrils daily. Patient taking differently: Place 2 sprays into both nostrils as needed. 12/28/19   Domenick Gong, MD  ibuprofen (ADVIL) 600 MG tablet Take 1 tablet (600 mg total) by mouth every 6 (six) hours as needed. 12/28/19   Domenick Gong, MD  meclizine (ANTIVERT) 25 MG tablet Take 1 tablet (25 mg total) by mouth 3 (three) times daily as needed for dizziness. 11/08/20   Duanne Limerick, MD  SUMAtriptan (IMITREX) 25 MG tablet Take 1 tablet (25 mg total) by mouth every 2 (two) hours as needed for migraine (may repeat 1 time). May repeat in 2 hours if headache persists or recurs. 06/10/21   Duanne Limerick, MD  VITAMIN D PO Take by mouth.    [provider]  norethindrone-ethinyl estradiol (FEMHRT 1/5) 1-5 MG-MCG TABS  tablet Take 1 tablet by mouth daily.  04/22/19  [provider]      Social History: She  reports that she has never smoked. She has never used smokeless tobacco. She reports current alcohol use. She reports that she does not use drugs.  Family History: family history includes Cancer in her paternal grandmother; Heart disease in her maternal grandmother; Hypertension in her mother.   Review of Systems: A full review of systems was performed and negative except as noted in the HPI.     O:  BP 115/71   Pulse 90   Temp 98.2 F (36.8 C) (Oral)   Resp 18   Ht 5\' 7"  (1.702 m)   Wt 87.5 kg   LMP 07/21/2021   BMI 30.23 kg/m   Vitals:   03/21/22 1422 03/21/22 1437 03/21/22 1452 03/21/22 1530  BP: 115/79 114/77 116/73 115/71    Results for orders placed or performed during the hospital encounter of 03/21/22 (from the past 48 hour(s))  Protein / creatinine ratio, urine   Collection Time: 03/21/22  2:31 PM  Result Value Ref Range   Creatinine, Urine 69 mg/dL   Total Protein, Urine 6 mg/dL   Protein Creatinine Ratio 0.09 0.00 - 0.15 mg/mg[Cre]  CBC   Collection Time: 03/21/22  3:00 PM  Result Value Ref Range   WBC 9.4 4.0 - 10.5 K/uL   RBC  4.46 3.87 - 5.11 MIL/uL   Hemoglobin 13.2 12.0 - 15.0 g/dL   HCT 39.1 36.0 - 46.0 %   MCV 87.7 80.0 - 100.0 fL   MCH 29.6 26.0 - 34.0 pg   MCHC 33.8 30.0 - 36.0 g/dL   RDW 13.0 11.5 - 15.5 %   Platelets 253 150 - 400 K/uL   nRBC 0.0 0.0 - 0.2 %  Comprehensive metabolic panel   Collection Time: 03/21/22  3:00 PM  Result Value Ref Range   Sodium 137 135 - 145 mmol/L   Potassium 3.6 3.5 - 5.1 mmol/L   Chloride 108 98 - 111 mmol/L   CO2 20 (L) 22 - 32 mmol/L   Glucose, Bld 86 70 - 99 mg/dL   BUN 7 6 - 20 mg/dL   Creatinine, Ser 0.48 0.44 - 1.00 mg/dL   Calcium 9.3 8.9 - 10.3 mg/dL   Total Protein 6.7 6.5 - 8.1 g/dL   Albumin 3.3 (L) 3.5 - 5.0 g/dL   AST 24 15 - 41 U/L   ALT 18 0 - 44 U/L   Alkaline Phosphatase 100 38 - 126  U/L   Total Bilirubin 0.5 0.3 - 1.2 mg/dL   GFR, Estimated >60 >60 mL/min   Anion gap 9 5 - 15     Constitutional: NAD, AAOx3  HE/ENT: extraocular movements grossly intact, moist mucous membranes CV: RRR PULM: nl respiratory effort, CTABL     Abd: gravid, non-tender, non-distended, soft      Fetal  monitoring: Cat I Appropriate for GA Baseline: 145bpm Variability: moderate Accelerations:  present x >2 Decelerations absent Time 42mins    A/P: 31 y.o. [redacted]w[redacted]d here for antenatal surveillance for elevated BP without dx GHTN  Principle Diagnosis:  High risk pregnancy in third trimester  Labor: not present.  Fetal Wellbeing: Reassuring Cat 1 tracing, Reactive NST  Normal labs, no Pre-E, no further elevated BP.  D/c home stable, precautions reviewed, follow-up as scheduled.    Francetta Found, CNM 03/21/2022  4:16 PM

## 2022-03-21 NOTE — OB Triage Note (Signed)
Discharge instructions, labor precautions, and follow-up care reviewed with patient. All questions answered. Patient verbalized understanding. Discharged ambulatory off unit.   

## 2022-03-21 NOTE — OB Triage Note (Signed)
Patient is a G3P1011 at [redacted]w[redacted]d who was sent over from the office for a PIH eval. Reports +FM, denies ctx, LOF, and vaginal bleeding. Denies HA, blurry vision, and right epigastric pain. Initial BP 115/79, cycling q81min.

## 2022-03-28 ENCOUNTER — Other Ambulatory Visit: Payer: Self-pay

## 2022-03-28 ENCOUNTER — Observation Stay: Payer: No Typology Code available for payment source

## 2022-03-28 ENCOUNTER — Observation Stay
Admission: EM | Admit: 2022-03-28 | Discharge: 2022-03-29 | Disposition: A | Discharge status: Home / Self Care | Payer: No Typology Code available for payment source | Attending: Certified Nurse Midwife | Admitting: Certified Nurse Midwife

## 2022-03-28 ENCOUNTER — Encounter: Payer: Self-pay | Admitting: Obstetrics and Gynecology

## 2022-03-28 DIAGNOSIS — O0993 Supervision of high risk pregnancy, unspecified, third trimester: Secondary | ICD-10-CM | POA: Diagnosis not present

## 2022-03-28 DIAGNOSIS — M25569 Pain in unspecified knee: Secondary | ICD-10-CM | POA: Diagnosis not present

## 2022-03-28 DIAGNOSIS — Z3A35 35 weeks gestation of pregnancy: Secondary | ICD-10-CM | POA: Diagnosis not present

## 2022-03-28 DIAGNOSIS — W1839XA Other fall on same level, initial encounter: Secondary | ICD-10-CM | POA: Diagnosis not present

## 2022-03-28 DIAGNOSIS — M533 Sacrococcygeal disorders, not elsewhere classified: Secondary | ICD-10-CM | POA: Insufficient documentation

## 2022-03-28 DIAGNOSIS — W19XXXA Unspecified fall, initial encounter: Secondary | ICD-10-CM | POA: Diagnosis present

## 2022-03-28 DIAGNOSIS — O99891 Other specified diseases and conditions complicating pregnancy: Secondary | ICD-10-CM | POA: Diagnosis present

## 2022-03-28 DIAGNOSIS — Y92009 Unspecified place in unspecified non-institutional (private) residence as the place of occurrence of the external cause: Secondary | ICD-10-CM | POA: Diagnosis not present

## 2022-03-28 MED ORDER — DOCUSATE SODIUM 100 MG PO CAPS: 100.0000 mg | ORAL_CAPSULE | Freq: Every day | ORAL | Status: DC

## 2022-03-28 MED ORDER — CALCIUM CARBONATE ANTACID 500 MG PO CHEW: 2.0000 | CHEWABLE_TABLET | ORAL | Status: DC | PRN

## 2022-03-28 MED ORDER — PRENATAL MULTIVITAMIN CH
1.0000 | ORAL_TABLET | Freq: Every day | ORAL | Status: DC
  Administered 2022-03-29: 1 via ORAL
  Filled 2022-03-28: qty 1

## 2022-03-28 MED ORDER — ACETAMINOPHEN 325 MG PO TABS
650.0000 mg | ORAL_TABLET | ORAL | Status: DC | PRN
  Administered 2022-03-29: 650 mg via ORAL
  Filled 2022-03-28: qty 2

## 2022-03-28 NOTE — H&P (Signed)
ANTEPARTUM ADMISSION HISTORY AND PHYSICAL NOTE  History of Present Illness: Doris Sutton is a 31 y.o. G3P1011 at [redacted]w[redacted]d admitted for observation after a fall on her bottom. She reports she was going to go sit down on a rolling stool but the stool was not behind her so she fell and landed on her bottom. Patient reports the fetal movement as active. Patient reports uterine contraction  activity as none. Patient reports  vaginal bleeding as none. Patient describes fluid per vagina as None. Fetal presentation is cephalic.  Patient Active Problem List   Diagnosis Date Noted   Fall 03/28/2022   Pregnancy 03/21/2022   Elevated BP without diagnosis of hypertension 03/21/2022    Past Medical History:  Diagnosis Date   Allergy     Past Surgical History:  Procedure Laterality Date   OVARIAN CYST REMOVAL Left 03/2015   TONSILLECTOMY      OB History  Gravida Para Term Preterm AB Living  3 1 1   1 1   SAB IAB Ectopic Multiple Live Births               # Outcome Date GA Lbr Len/2nd Weight Sex Delivery Anes PTL Lv  3 Current           2 Term 2020     Vag-Spont     1 AB             Social History   Socioeconomic History   Marital status: Married    Spouse name: Marjory Lies   Number of children: 1   Years of education: Not on file   Highest education level: Not on file  Occupational History   Not on file  Tobacco Use   Smoking status: Never   Smokeless tobacco: Never  Vaping Use   Vaping Use: Never used  Substance and Sexual Activity   Alcohol use: Not Currently    Comment: occasionally   Drug use: Never   Sexual activity: Yes  Other Topics Concern   Not on file  Social History Narrative   Not on file   Social Determinants of Health   Financial Resource Strain: Not on file  Food Insecurity: Not on file  Transportation Needs: Not on file  Physical Activity: Not on file  Stress: Not on file  Social Connections: Not on file    Family History  Problem Relation Age of  Onset   Hypertension Mother    Heart disease Maternal Grandmother    Cancer Paternal Grandmother     Allergies  Allergen Reactions   Cheese     Aged cheese-GI issues   Citric Acid     ABD pain/GI issues    Medications Prior to Admission  Medication Sig Dispense Refill Last Dose   Ascorbic Acid (VITAMIN C) 100 MG tablet Take 200 mg by mouth daily.   Past Month   fluticasone (FLONASE) 50 MCG/ACT nasal spray Place 2 sprays into both nostrils daily. (Patient not taking: Reported on 03/28/2022) 16 g 0 Completed Course   ibuprofen (ADVIL) 600 MG tablet Take 1 tablet (600 mg total) by mouth every 6 (six) hours as needed. 30 tablet 0    meclizine (ANTIVERT) 25 MG tablet Take 1 tablet (25 mg total) by mouth 3 (three) times daily as needed for dizziness. 30 tablet 0    SUMAtriptan (IMITREX) 25 MG tablet Take 1 tablet (25 mg total) by mouth every 2 (two) hours as needed for migraine (may repeat 1 time). May repeat in  2 hours if headache persists or recurs. 10 tablet 0    VITAMIN D PO Take by mouth.       Review of Systems - Negative except soreness on her bottom  Vitals:  BP 127/74   Pulse 90   LMP 07/21/2021  Physical Examination: CONSTITUTIONAL: Well-developed, well-nourished female in no acute distress.  HENT:  Normocephalic, atraumatic, External right and left ear normal. Oropharynx is clear and moist EYES: Conjunctivae and EOM are normal. Pupils are equal, round, and reactive to light. No scleral icterus.  NECK: Normal range of motion, supple, no masses SKIN: Skin is warm and dry. No rash noted. Not diaphoretic. No erythema. No pallor. NEUROLGIC: Alert and oriented to person, place, and time. Normal reflexes, muscle tone coordination. No cranial nerve deficit noted. PSYCHIATRIC: Normal mood and affect. Normal behavior. Normal judgment and thought content. CARDIOVASCULAR: Normal heart rate noted, regular rhythm RESPIRATORY: Effort and breath sounds normal, no problems with respiration  noted ABDOMEN: Soft, nontender, nondistended, gravid. MUSCULOSKELETAL: Normal range of motion. No edema and no tenderness. 2+ distal pulses.  Cervix: deferred Membranes:intact Fetal Monitoring:Baseline: 135 bpm, Variability: Good {> 6 bpm), Accelerations: Reactive, and Decelerations: Absent Tocometer: Flat  Labs:  No results found for this or any previous visit (from the past 24 hour(s)).  Imaging Studies: US OB Limited  Result Date: 03/28/2022 CLINICAL DATA:  35 weeks 5 days gestational age pregnancy. Fall onto buttocks. No pain or bleeding. EXAM: LIMITED OBSTETRIC ULTRASOUND COMPARISON:  Pelvic ultrasound 09/28/2019 FINDINGS: Number of Fetuses: 1 Heart Rate:  148 bpm Movement: Yes Presentation: Cephalic Placental Location: Lateral Right Previa: No Amniotic Fluid (Subjective):  Within normal limits. AFI: 11.5 cm Femur length: 6.9 cm 35 w  3 d MATERNAL FINDINGS: Cervix:  Unable to be visualized Uterus/Adnexae: No abnormality visualized. The lower uterine segment of the uterus was not well visualized. IMPRESSION: No placental abruption or other acute abnormality is visualized. Single live uterine pregnancy with fetal heart rate measured at 148 beats per minute. Normal amniotic fluid index. Electronically Signed   By: Neita Garnet M.D.   On: 03/28/2022 19:00     Assessment and Plan: Patient Active Problem List   Diagnosis Date Noted   Fall 03/28/2022   Pregnancy 03/21/2022   Elevated BP without diagnosis of hypertension 03/21/2022   Admit to Antenatal Reviewed ultrasound images with Dr. Dalbert Garnet. It appears she might have a small area of a placental abruption in the images and Dr. Dalbert Garnet agrees. 24 hours of continuous fetal monitoring. Kleihauer-Betke stain ordered and pending. Her blood type is O positive, so no need for Rhogam.  Routine antenatal care.  Janyce Llanos, CNM 03/28/2022 10:50 PM

## 2022-03-28 NOTE — OB Triage Note (Signed)
Patient is G3P1 and is [redacted] weeks gestation. She presents from ED with complaints of a fall that happened around 2pm at work. Patient states that she was trying to sit on a chair and it slid out from under her. She is not complaining of any bleeding or leaking of fluid and denies contractions. Pt is going to ultrasound and will be placed on monitor for observation. FHT 135

## 2022-03-29 DIAGNOSIS — O99891 Other specified diseases and conditions complicating pregnancy: Secondary | ICD-10-CM | POA: Diagnosis not present

## 2022-03-29 LAB — KLEIHAUER-BETKE STAIN
Fetal Cells %: 0.9 %
Quantitation Fetal Hemoglobin: 0.0045 mL

## 2022-03-29 LAB — ABO/RH: ABO/RH(D): O POS

## 2022-03-29 MED ORDER — DOXYLAMINE SUCCINATE (SLEEP) 25 MG PO TABS
25.0000 mg | ORAL_TABLET | Freq: Every evening | ORAL | Status: DC | PRN
  Administered 2022-03-29: 25 mg via ORAL
  Filled 2022-03-29: qty 1

## 2022-03-29 MED ORDER — MAGNESIUM OXIDE -MG SUPPLEMENT 400 (240 MG) MG PO TABS
400.0000 mg | ORAL_TABLET | Freq: Two times a day (BID) | ORAL | Status: DC
  Administered 2022-03-29: 400 mg via ORAL
  Filled 2022-03-29 (×2): qty 1

## 2022-03-29 NOTE — OB Triage Note (Signed)
Pt discharged home per order.  Pt stable and ambulatory and an After Visit Summary was printed and given to the patient. Discharge education completed with patient/family including follow up instructions, appointments, and medication list. Pt received labor and bleeding precautions. Patient able to verbalize understanding, all questions fully answered upon discharge. Patient instructed to return to ED, call 911, or call MD for any changes in condition.  Pt not feeling contractions. Positive fetal movement and no bleeding noted. Will call office for NST appt early next week.

## 2022-03-29 NOTE — Progress Notes (Signed)
Pt reports progressing knee and pain on her coccyx. Pt unaware of contractions. PO hydration, ice, and Tylenol given- rest encouraged. CNM aware.

## 2022-03-29 NOTE — Discharge Summary (Signed)
Patient ID: AVINA EBERLE MRN: [id number] DOB/AGE: 06-29-1990 29 y.o.  Admit date: 03/28/2022 Discharge date: 03/29/2022  Admission Diagnoses: 31yo G3P1 at 100w6d presents after a fall on her bottom. She reports she was going to go sit down on a rolling stool but the stool was not behind her so she fell and landed on her bottom.  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 G3P1011 with IUP at [redacted]w[redacted]d admitted for a fall.  No leaking of fluid and no bleeding.  Ultrasound and labs done.  She was observed, fetal heart rate monitoring remained reassuring, and she had no signs/symptoms of preterm labor or other maternal-fetal concerns.  She was deemed stable for discharge to home with outpatient follow up.  Discharge Physical Exam:  BP 113/73 (BP Location: Left Arm)   Pulse 89   Temp 98.5 F (36.9 C) (Oral)   Resp 18   LMP 07/21/2021   General: NAD CV: RRR Pulm: CTABL, nl effort ABD: s/nd/nt, gravid DVT Evaluation: LE non-ttp, no evidence of DVT on exam.  NST: FHR baseline: 140 bpm Variability: moderate Accelerations: yes Decelerations: none Category/reactivity: reactive Amniotic Fluid Index: 11.5 cm measurement done yesterday  TOCO: irritability SVE: deferred      Discharge Condition: Stable  Disposition: Discharge disposition: 01-Home or Self  Care        Allergies as of 03/29/2022       Reactions   Cheese    Aged cheese-GI issues   Citric Acid    ABD pain/GI issues        Medication List     STOP taking these medications    ibuprofen 600 MG tablet Commonly known as: ADVIL   meclizine 25 MG tablet Commonly known as: ANTIVERT   SUMAtriptan 25 MG tablet Commonly known as: IMITREX   VITAMIN D PO       TAKE these medications    fluticasone 50 MCG/ACT nasal spray Commonly known as: FLONASE Place 2 sprays into both nostrils daily.   vitamin C 100 MG tablet Take 200 mg by mouth daily.        Follow-up [facility]. Schedule an appointment as soon as possible for a visit in 2 day(s).   Contact information: [address] [place] [phone]                Signed:  Regina Eck 03/29/2022 1:03 PM

## 2022-04-04 LAB — OB RESULTS CONSOLE GBS: GBS: POSITIVE

## 2022-04-04 LAB — OB RESULTS CONSOLE HIV ANTIBODY (ROUTINE TESTING): HIV: NONREACTIVE

## 2022-04-26 ENCOUNTER — Encounter: Payer: Self-pay | Admitting: Obstetrics and Gynecology

## 2022-04-26 ENCOUNTER — Inpatient Hospital Stay: Payer: BLUE CROSS/BLUE SHIELD | Admitting: Registered Nurse

## 2022-04-26 ENCOUNTER — Other Ambulatory Visit: Payer: Self-pay

## 2022-04-26 ENCOUNTER — Inpatient Hospital Stay
Admission: EM | Admit: 2022-04-26 | Discharge: 2022-04-28 | DRG: 787 | Disposition: A | Discharge status: Home / Self Care | Payer: BLUE CROSS/BLUE SHIELD | Attending: Obstetrics and Gynecology | Admitting: Obstetrics and Gynecology

## 2022-04-26 ENCOUNTER — Encounter
Admission: EM | Disposition: A | Discharge status: Home / Self Care | Payer: Self-pay | Source: Home / Self Care | Attending: Obstetrics and Gynecology

## 2022-04-26 DIAGNOSIS — G43909 Migraine, unspecified, not intractable, without status migrainosus: Secondary | ICD-10-CM | POA: Diagnosis present

## 2022-04-26 DIAGNOSIS — O4593 Premature separation of placenta, unspecified, third trimester: Principal | ICD-10-CM | POA: Diagnosis present

## 2022-04-26 DIAGNOSIS — O99354 Diseases of the nervous system complicating childbirth: Secondary | ICD-10-CM | POA: Diagnosis present

## 2022-04-26 DIAGNOSIS — Z3A39 39 weeks gestation of pregnancy: Secondary | ICD-10-CM

## 2022-04-26 DIAGNOSIS — O458X9 Other premature separation of placenta, unspecified trimester: Secondary | ICD-10-CM | POA: Diagnosis present

## 2022-04-26 DIAGNOSIS — Z98891 History of uterine scar from previous surgery: Principal | ICD-10-CM

## 2022-04-26 HISTORY — DX: Headache, unspecified: R51.9

## 2022-04-26 LAB — CBC
HCT: 41.4 % (ref 36.0–46.0)
Hemoglobin: 14 g/dL (ref 12.0–15.0)
MCH: 29.2 pg (ref 26.0–34.0)
MCHC: 33.8 g/dL (ref 30.0–36.0)
MCV: 86.3 fL (ref 80.0–100.0)
Platelets: 255 10*3/uL (ref 150–400)
RBC: 4.8 MIL/uL (ref 3.87–5.11)
RDW: 13.4 % (ref 11.5–15.5)
WBC: 9.6 10*3/uL (ref 4.0–10.5)
nRBC: 0 % (ref 0.0–0.2)

## 2022-04-26 LAB — TYPE AND SCREEN
ABO/RH(D): O POS
Antibody Screen: NEGATIVE

## 2022-04-26 SURGERY — Surgical Case
Anesthesia: Spinal

## 2022-04-26 MED ORDER — FENTANYL CITRATE (PF) 100 MCG/2ML IJ SOLN
INTRAMUSCULAR | Status: DC | PRN
  Administered 2022-04-26: 15 ug via INTRAVENOUS

## 2022-04-26 MED ORDER — BUPIVACAINE IN DEXTROSE 0.75-8.25 % IT SOLN
INTRATHECAL | Status: DC | PRN
  Administered 2022-04-26: 1.6 mL via INTRATHECAL

## 2022-04-26 MED ORDER — ACETAMINOPHEN 500 MG PO TABS
1000.0000 mg | ORAL_TABLET | ORAL | Status: AC
  Administered 2022-04-26: 1000 mg via ORAL

## 2022-04-26 MED ORDER — PROMETHAZINE HCL 25 MG PO TABS: 25.0000 mg | ORAL_TABLET | Freq: Four times a day (QID) | ORAL | Status: DC | PRN

## 2022-04-26 MED ORDER — ACETAMINOPHEN 500 MG PO TABS
ORAL_TABLET | ORAL | Status: AC
  Filled 2022-04-26: qty 2

## 2022-04-26 MED ORDER — SIMETHICONE 80 MG PO CHEW
80.0000 mg | CHEWABLE_TABLET | ORAL | Status: DC | PRN
  Administered 2022-04-27: 80 mg via ORAL
  Filled 2022-04-26: qty 1

## 2022-04-26 MED ORDER — GABAPENTIN 300 MG PO CAPS
ORAL_CAPSULE | ORAL | Status: AC
  Administered 2022-04-26: 300 mg via ORAL
  Filled 2022-04-26: qty 1

## 2022-04-26 MED ORDER — OXYTOCIN-SODIUM CHLORIDE 30-0.9 UT/500ML-% IV SOLN
2.5000 [IU]/h | INTRAVENOUS | Status: DC
  Administered 2022-04-26: 2.5 [IU]/h via INTRAVENOUS
  Filled 2022-04-26 (×2): qty 500

## 2022-04-26 MED ORDER — PENICILLIN G POT IN DEXTROSE 60000 UNIT/ML IV SOLN: 3.0000 10*6.[IU] | INTRAVENOUS | Status: DC

## 2022-04-26 MED ORDER — ONDANSETRON HCL 4 MG/2ML IJ SOLN
4.0000 mg | Freq: Three times a day (TID) | INTRAMUSCULAR | Status: DC | PRN
  Filled 2022-04-26 (×2): qty 2

## 2022-04-26 MED ORDER — MISOPROSTOL 50MCG HALF TABLET
50.0000 ug | ORAL_TABLET | Freq: Once | ORAL | Status: DC
  Filled 2022-04-26: qty 1

## 2022-04-26 MED ORDER — MISOPROSTOL 200 MCG PO TABS
ORAL_TABLET | ORAL | Status: AC
  Filled 2022-04-26: qty 4

## 2022-04-26 MED ORDER — MORPHINE SULFATE (PF) 2 MG/ML IV SOLN: 1.0000 mg | INTRAVENOUS | Status: DC | PRN

## 2022-04-26 MED ORDER — GABAPENTIN 300 MG PO CAPS: 300.0000 mg | ORAL_CAPSULE | Freq: Once | ORAL | Status: AC

## 2022-04-26 MED ORDER — OXYTOCIN BOLUS FROM INFUSION: 333.0000 mL | Freq: Once | INTRAVENOUS | Status: DC

## 2022-04-26 MED ORDER — LIDOCAINE HCL (PF) 1 % IJ SOLN
30.0000 mL | INTRAMUSCULAR | Status: DC | PRN
  Filled 2022-04-26: qty 30

## 2022-04-26 MED ORDER — DEXAMETHASONE SODIUM PHOSPHATE 10 MG/ML IJ SOLN
INTRAMUSCULAR | Status: AC
  Filled 2022-04-26: qty 1

## 2022-04-26 MED ORDER — ACETAMINOPHEN 325 MG PO TABS: 650.0000 mg | ORAL_TABLET | ORAL | Status: DC | PRN

## 2022-04-26 MED ORDER — BUPIVACAINE HCL (PF) 0.5 % IJ SOLN
INTRAMUSCULAR | Status: DC | PRN
  Administered 2022-04-26: 60 mL

## 2022-04-26 MED ORDER — OXYTOCIN-SODIUM CHLORIDE 30-0.9 UT/500ML-% IV SOLN
1.0000 m[IU]/min | INTRAVENOUS | Status: DC
  Administered 2022-04-26: 2 m[IU]/min via INTRAVENOUS

## 2022-04-26 MED ORDER — SODIUM CHLORIDE 0.9 % IV SOLN
5.0000 10*6.[IU] | Freq: Once | INTRAVENOUS | Status: AC
  Administered 2022-04-26: 5 10*6.[IU] via INTRAVENOUS
  Filled 2022-04-26: qty 5

## 2022-04-26 MED ORDER — PHENYLEPHRINE HCL (PRESSORS) 10 MG/ML IV SOLN
INTRAVENOUS | Status: AC
  Filled 2022-04-26: qty 1

## 2022-04-26 MED ORDER — DEXAMETHASONE SODIUM PHOSPHATE 10 MG/ML IJ SOLN
INTRAMUSCULAR | Status: DC | PRN
  Administered 2022-04-26: 10 mg via INTRAVENOUS

## 2022-04-26 MED ORDER — ONDANSETRON HCL 4 MG/2ML IJ SOLN
INTRAMUSCULAR | Status: AC
  Filled 2022-04-26: qty 2

## 2022-04-26 MED ORDER — SODIUM CHLORIDE 0.9 % IV SOLN
25.0000 mg | Freq: Four times a day (QID) | INTRAVENOUS | Status: DC | PRN
  Administered 2022-04-26: 25 mg via INTRAVENOUS
  Filled 2022-04-26: qty 25

## 2022-04-26 MED ORDER — DIBUCAINE (PERIANAL) 1 % EX OINT: 1.0000 | TOPICAL_OINTMENT | CUTANEOUS | Status: DC | PRN

## 2022-04-26 MED ORDER — KETOROLAC TROMETHAMINE 30 MG/ML IJ SOLN
30.0000 mg | Freq: Four times a day (QID) | INTRAMUSCULAR | Status: AC | PRN
  Administered 2022-04-26: 30 mg via INTRAVENOUS
  Filled 2022-04-26: qty 1

## 2022-04-26 MED ORDER — MENTHOL 3 MG MT LOZG: 1.0000 | LOZENGE | OROMUCOSAL | Status: DC | PRN

## 2022-04-26 MED ORDER — IBUPROFEN 600 MG PO TABS
600.0000 mg | ORAL_TABLET | Freq: Four times a day (QID) | ORAL | Status: DC
  Administered 2022-04-27 – 2022-04-28 (×3): 600 mg via ORAL
  Filled 2022-04-26 (×3): qty 1

## 2022-04-26 MED ORDER — SODIUM CHLORIDE 0.9% FLUSH
INTRAVENOUS | Status: DC | PRN
  Administered 2022-04-26: 20 mL via INTRAVENOUS

## 2022-04-26 MED ORDER — TERBUTALINE SULFATE 1 MG/ML IJ SOLN: 0.2500 mg | Freq: Once | INTRAMUSCULAR | Status: DC | PRN

## 2022-04-26 MED ORDER — LACTATED RINGERS IV SOLN: INTRAVENOUS | Status: DC

## 2022-04-26 MED ORDER — PRENATAL MULTIVITAMIN CH
1.0000 | ORAL_TABLET | Freq: Every day | ORAL | Status: DC
  Administered 2022-04-27: 1 via ORAL
  Filled 2022-04-26: qty 1

## 2022-04-26 MED ORDER — MISOPROSTOL 25 MCG QUARTER TABLET
25.0000 ug | ORAL_TABLET | Freq: Once | ORAL | Status: DC
  Filled 2022-04-26: qty 1

## 2022-04-26 MED ORDER — PHENYLEPHRINE HCL-NACL 20-0.9 MG/250ML-% IV SOLN
INTRAVENOUS | Status: DC | PRN
  Administered 2022-04-26: 30 ug/min via INTRAVENOUS

## 2022-04-26 MED ORDER — OXYCODONE HCL 5 MG PO TABS
5.0000 mg | ORAL_TABLET | ORAL | Status: DC | PRN
  Administered 2022-04-27 – 2022-04-28 (×2): 5 mg via ORAL
  Filled 2022-04-26: qty 1

## 2022-04-26 MED ORDER — DIPHENHYDRAMINE HCL 25 MG PO CAPS: 25.0000 mg | ORAL_CAPSULE | ORAL | Status: DC | PRN

## 2022-04-26 MED ORDER — WITCH HAZEL-GLYCERIN EX PADS: 1.0000 | MEDICATED_PAD | CUTANEOUS | Status: DC | PRN

## 2022-04-26 MED ORDER — FENTANYL CITRATE (PF) 100 MCG/2ML IJ SOLN
INTRAMUSCULAR | Status: AC
  Filled 2022-04-26: qty 2

## 2022-04-26 MED ORDER — GABAPENTIN 300 MG PO CAPS
300.0000 mg | ORAL_CAPSULE | Freq: Every day | ORAL | Status: DC
  Administered 2022-04-26 – 2022-04-27 (×2): 300 mg via ORAL
  Filled 2022-04-26 (×2): qty 1

## 2022-04-26 MED ORDER — KETOROLAC TROMETHAMINE 30 MG/ML IJ SOLN
30.0000 mg | Freq: Four times a day (QID) | INTRAMUSCULAR | Status: AC
  Administered 2022-04-26 – 2022-04-27 (×4): 30 mg via INTRAVENOUS
  Filled 2022-04-26 (×4): qty 1

## 2022-04-26 MED ORDER — SODIUM CHLORIDE 0.9% FLUSH: 3.0000 mL | INTRAVENOUS | Status: DC | PRN

## 2022-04-26 MED ORDER — SIMETHICONE 80 MG PO CHEW
80.0000 mg | CHEWABLE_TABLET | Freq: Three times a day (TID) | ORAL | Status: DC
  Administered 2022-04-26 – 2022-04-28 (×5): 80 mg via ORAL
  Filled 2022-04-26 (×5): qty 1

## 2022-04-26 MED ORDER — CEFAZOLIN SODIUM-DEXTROSE 2-4 GM/100ML-% IV SOLN
INTRAVENOUS | Status: AC
  Filled 2022-04-26: qty 100

## 2022-04-26 MED ORDER — OXYCODONE HCL 5 MG PO TABS
5.0000 mg | ORAL_TABLET | Freq: Four times a day (QID) | ORAL | Status: DC | PRN
  Filled 2022-04-26: qty 1

## 2022-04-26 MED ORDER — PROMETHAZINE HCL 25 MG RE SUPP: 25.0000 mg | Freq: Four times a day (QID) | RECTAL | Status: DC | PRN

## 2022-04-26 MED ORDER — 0.9 % SODIUM CHLORIDE (POUR BTL) OPTIME
TOPICAL | Status: DC | PRN
  Administered 2022-04-26: 1000 mL

## 2022-04-26 MED ORDER — FENTANYL CITRATE (PF) 100 MCG/2ML IJ SOLN: 50.0000 ug | INTRAMUSCULAR | Status: DC | PRN

## 2022-04-26 MED ORDER — SOD CITRATE-CITRIC ACID 500-334 MG/5ML PO SOLN: 30.0000 mL | ORAL | Status: AC

## 2022-04-26 MED ORDER — MEPERIDINE HCL 25 MG/ML IJ SOLN: 6.2500 mg | INTRAMUSCULAR | Status: DC | PRN

## 2022-04-26 MED ORDER — ONDANSETRON HCL 4 MG/2ML IJ SOLN
INTRAMUSCULAR | Status: DC | PRN
  Administered 2022-04-26: 4 mg via INTRAVENOUS

## 2022-04-26 MED ORDER — SENNOSIDES-DOCUSATE SODIUM 8.6-50 MG PO TABS
2.0000 | ORAL_TABLET | Freq: Every day | ORAL | Status: DC
  Administered 2022-04-27 – 2022-04-28 (×2): 2 via ORAL
  Filled 2022-04-26 (×2): qty 2

## 2022-04-26 MED ORDER — ONDANSETRON HCL 4 MG/2ML IJ SOLN: 4.0000 mg | Freq: Four times a day (QID) | INTRAMUSCULAR | Status: DC | PRN

## 2022-04-26 MED ORDER — ACETAMINOPHEN 500 MG PO TABS
1000.0000 mg | ORAL_TABLET | Freq: Four times a day (QID) | ORAL | Status: DC
  Administered 2022-04-26 – 2022-04-28 (×6): 1000 mg via ORAL
  Filled 2022-04-26 (×7): qty 2

## 2022-04-26 MED ORDER — OXYTOCIN 10 UNIT/ML IJ SOLN
INTRAMUSCULAR | Status: AC
  Filled 2022-04-26: qty 2

## 2022-04-26 MED ORDER — ZOLPIDEM TARTRATE 5 MG PO TABS: 5.0000 mg | ORAL_TABLET | Freq: Every evening | ORAL | Status: DC | PRN

## 2022-04-26 MED ORDER — DIPHENHYDRAMINE HCL 25 MG PO CAPS: 25.0000 mg | ORAL_CAPSULE | Freq: Four times a day (QID) | ORAL | Status: DC | PRN

## 2022-04-26 MED ORDER — BUPIVACAINE HCL (PF) 0.5 % IJ SOLN
INTRAMUSCULAR | Status: AC
  Filled 2022-04-26: qty 60

## 2022-04-26 MED ORDER — MORPHINE SULFATE (PF) 0.5 MG/ML IJ SOLN
INTRAMUSCULAR | Status: DC | PRN
  Administered 2022-04-26: .1 mg via EPIDURAL

## 2022-04-26 MED ORDER — NALOXONE HCL 0.4 MG/ML IJ SOLN: 0.4000 mg | INTRAMUSCULAR | Status: DC | PRN

## 2022-04-26 MED ORDER — BUPIVACAINE HCL (PF) 0.5 % IJ SOLN: 60.0000 mL | Freq: Once | INTRAMUSCULAR | Status: DC

## 2022-04-26 MED ORDER — ENOXAPARIN SODIUM 40 MG/0.4ML IJ SOSY
40.0000 mg | PREFILLED_SYRINGE | INTRAMUSCULAR | Status: DC
  Administered 2022-04-27: 40 mg via SUBCUTANEOUS
  Filled 2022-04-26: qty 0.4

## 2022-04-26 MED ORDER — AMMONIA AROMATIC IN INHA
RESPIRATORY_TRACT | Status: AC
  Filled 2022-04-26: qty 10

## 2022-04-26 MED ORDER — SODIUM CHLORIDE 0.9% FLUSH: 20.0000 mL | Freq: Once | INTRAVENOUS | Status: DC

## 2022-04-26 MED ORDER — KETOROLAC TROMETHAMINE 30 MG/ML IJ SOLN: 30.0000 mg | Freq: Four times a day (QID) | INTRAMUSCULAR | Status: AC | PRN

## 2022-04-26 MED ORDER — CEFAZOLIN SODIUM-DEXTROSE 2-4 GM/100ML-% IV SOLN
2.0000 g | INTRAVENOUS | Status: AC
  Administered 2022-04-26: 2 g via INTRAVENOUS

## 2022-04-26 MED ORDER — LACTATED RINGERS IV SOLN
500.0000 mL | INTRAVENOUS | Status: DC | PRN
  Administered 2022-04-26: 500 mL via INTRAVENOUS

## 2022-04-26 MED ORDER — MORPHINE SULFATE (PF) 0.5 MG/ML IJ SOLN
INTRAMUSCULAR | Status: AC
  Filled 2022-04-26: qty 10

## 2022-04-26 MED ORDER — DIPHENHYDRAMINE HCL 50 MG/ML IJ SOLN: 12.5000 mg | INTRAMUSCULAR | Status: DC | PRN

## 2022-04-26 MED ORDER — OXYTOCIN-SODIUM CHLORIDE 30-0.9 UT/500ML-% IV SOLN: 2.5000 [IU]/h | INTRAVENOUS | Status: AC

## 2022-04-26 MED ORDER — SOD CITRATE-CITRIC ACID 500-334 MG/5ML PO SOLN
ORAL | Status: AC
  Administered 2022-04-26: 30 mL via ORAL
  Filled 2022-04-26: qty 15

## 2022-04-26 MED ORDER — OXYTOCIN-SODIUM CHLORIDE 30-0.9 UT/500ML-% IV SOLN
INTRAVENOUS | Status: DC | PRN
  Administered 2022-04-26: 300 mL via INTRAVENOUS

## 2022-04-26 MED ORDER — NALOXONE HCL 4 MG/10ML IJ SOLN: 1.0000 ug/kg/h | INTRAVENOUS | Status: DC | PRN

## 2022-04-26 MED ORDER — COCONUT OIL OIL: 1.0000 | TOPICAL_OIL | Status: DC | PRN

## 2022-04-26 SURGICAL SUPPLY — 30 items
BARRIER ADHS 3X4 INTERCEED (GAUZE/BANDAGES/DRESSINGS) ×1 IMPLANT
CHLORAPREP W/TINT 26 (MISCELLANEOUS) ×1 IMPLANT
DRSG TELFA 3X8 NADH STRL (GAUZE/BANDAGES/DRESSINGS) ×1 IMPLANT
ELECT CAUTERY BLADE 6.4 (BLADE) ×1 IMPLANT
ELECT REM PT RETURN 9FT ADLT (ELECTROSURGICAL) ×1
ELECTRODE REM PT RTRN 9FT ADLT (ELECTROSURGICAL) ×1 IMPLANT
GAUZE PAD ABD 8X10 STRL (GAUZE/BANDAGES/DRESSINGS) IMPLANT
GAUZE SPONGE 4X4 12PLY STRL (GAUZE/BANDAGES/DRESSINGS) ×1 IMPLANT
GLOVE SURG SYN 8.0 (GLOVE) ×1 IMPLANT
GLOVE SURG SYN 8.0 PF PI (GLOVE) ×1 IMPLANT
GOWN STRL REUS W/ TWL LRG LVL3 (GOWN DISPOSABLE) ×2 IMPLANT
GOWN STRL REUS W/ TWL XL LVL3 (GOWN DISPOSABLE) ×1 IMPLANT
GOWN STRL REUS W/TWL LRG LVL3 (GOWN DISPOSABLE) ×2
GOWN STRL REUS W/TWL XL LVL3 (GOWN DISPOSABLE) ×1
MANIFOLD NEPTUNE II (INSTRUMENTS) ×1 IMPLANT
MAT PREVALON FULL STRYKER (MISCELLANEOUS) ×1 IMPLANT
NEEDLE HYPO 22GX1.5 SAFETY (NEEDLE) ×1 IMPLANT
NS IRRIG 1000ML POUR BTL (IV SOLUTION) ×1 IMPLANT
PACK C SECTION AR (MISCELLANEOUS) ×1 IMPLANT
PAD OB MATERNITY 4.3X12.25 (PERSONAL CARE ITEMS) ×1 IMPLANT
PAD PREP 24X41 OB/GYN DISP (PERSONAL CARE ITEMS) ×1 IMPLANT
SCRUB CHG 4% DYNA-HEX 4OZ (MISCELLANEOUS) ×1 IMPLANT
STRAP SAFETY 5IN WIDE (MISCELLANEOUS) ×1 IMPLANT
SUCT VACUUM KIWI BELL (SUCTIONS) IMPLANT
SUT CHROMIC 1 CTX 36 (SUTURE) ×3 IMPLANT
SUT PLAIN GUT 0 (SUTURE) ×2 IMPLANT
SUT VIC AB 0 CT1 36 (SUTURE) ×2 IMPLANT
SYR 30ML LL (SYRINGE) ×2 IMPLANT
TRAP FLUID SMOKE EVACUATOR (MISCELLANEOUS) ×1 IMPLANT
WATER STERILE IRR 500ML POUR (IV SOLUTION) ×1 IMPLANT

## 2022-04-26 NOTE — H&P (Signed)
OB History & Physical   History of Present Illness:  Chief Complaint: induction  HPI:  Doris Sutton is a 31 y.o. G3P1011 female at [redacted]w[redacted]d dated by [place] at [redacted]w[redacted]d; [facility] 04/27/22.  She presents to L&D for induction at term due to partial concealed abriuption after fall at 35wks; and parvovirus during pregnancy.   Active FM; having irreg UCs, denies LOF or VB    Pregnancy Issues: 1. Fall at 35wks causing concealed placental abruption - 03/28/22: monitored 24hrs in L&D, small concealed placental abruption seen on [place]; KB stain showed exposure 2. Migraines:followed by [facility]; taking magnesium and Riboflavin 3. Positive for Parvo virus in pregnancy    Maternal Medical History:   Past Medical History:  Diagnosis Date   Allergy     Past Surgical History:  Procedure Laterality Date   OVARIAN CYST REMOVAL Left 03/2015   TONSILLECTOMY      Allergies  Allergen Reactions   Cheese     Aged cheese-GI issues   Citric Acid     ABD pain/GI issues    Prior to Admission medications   Medication Sig Start Date End Date Taking? Authorizing Provider  Ascorbic Acid (VITAMIN C) 100 MG tablet Take 200 mg by mouth daily.    [provider]  fluticasone (FLONASE) 50 MCG/ACT nasal spray Place 2 sprays into both nostrils daily. Patient not taking: Reported on 03/28/2022 12/28/19   Domenick Gong, MD  norethindrone-ethinyl estradiol (FEMHRT 1/5) 1-5 MG-MCG TABS tablet Take 1 tablet by mouth daily.  04/22/19  [provider]     Prenatal care site: [facility]   Social History: She  reports that she has never smoked. She has never used smokeless tobacco. She reports that she does not currently use alcohol. She reports that she does not use drugs.  Family History: family history includes Cancer in her paternal grandmother; Heart disease in her maternal grandmother; Hypertension in her mother.   Review of Systems: A full review of systems was performed and negative  except as noted in the HPI.     Physical Exam:  Vital Signs: BP 131/89 (BP Location: Left Arm)   Pulse 98   Temp 98 F (36.7 C) (Oral)   Resp 18   LMP 07/08/2021   General: no acute distress.  HEENT: normocephalic, atraumatic Heart: regular rate & rhythm.  No murmurs/rubs/gallops Lungs: clear to auscultation bilaterally, normal respiratory effort Abdomen: soft, gravid, non-tender;  EFW: 7.5lbs Pelvic:   External: Normal external female genitalia  Cervix: Dilation: 1.5 / Effacement (%): 80 / Station: -2    Extremities: non-tender, symmetric, no edema bilaterally.  DTRs: 2+  Neurologic: Alert & oriented x 3.    No results found for this or any previous visit (from the past 24 hour(s)).  Pertinent Results:  Prenatal Labs: Blood type/Rh O POS  Antibody screen neg  Rubella Immune  Varicella Immune  RPR NR  HBsAg Neg  HIV NR  GC neg  Chlamydia neg  Genetic screening Negative materniT21 and AFP  1 hour GTT  113  3 hour GTT   GBS  POS   FHT: 140bpm, mod var, + accels 10*10bpm, no decels TOCO: q3-49min, mild SVE:  Dilation: 1.5 / Effacement (%): 80 / Station: -2  Soft/posterior   Cephalic by leopolds/SVE    Assessment:  Doris Sutton is a 31 y.o. G33P1011 female at [redacted]w[redacted]d with induction of labor.   Plan:  1. Admit to Labor & Delivery; consents reviewed and obtained -  Dr Feliberto Gottron notified of admission and POC.   2. Fetal Well being  - Fetal Tracing: Cat I - Group B Streptococcus ppx indicated: Pos - Presentation: cephalic confirmed by exam and Leopolds   3. Routine OB: - Prenatal labs reviewed, as above - Rh O Pos - CBC, T&S, RPR on admit - Clear fluids, IVF  4. Induction of Labor -  Contractions: external toco in place -  Pelvis proven to 7#14 -  Plan for induction with Pitocin, AROM -  Plan for continuous fetal monitoring  -  Maternal pain control as desired; requesting regional anesthesia when ready - Anticipate vaginal delivery  5. Post Partum  Planning: - Infant feeding: breast - Contraception: spouse vasectomy - Tdap and Flu 02/18/22  Randa Ngo, CNM 04/26/22 9:01 AM

## 2022-04-26 NOTE — Progress Notes (Signed)
Recurrent late decelerations with decreased variability , early in labor . Partial concealed placental abruption  4 weeks ealier . HAve to presume placental unit is not fully functional .  Recommendation for primary LTCS . She declines sterilization . The risks of cesarean section discussed with the patient included but were not limited to: bleeding which may require transfusion or reoperation; infection which may require antibiotics; injury to bowel, bladder, ureters or other surrounding organs; injury to the fetus; need for additional procedures including hysterectomy in the event of a life-threatening hemorrhage; placental abnormalities wth subsequent pregnancies, incisional problems, thromboembolic phenomenon and other postoperative/anesthesia complications. The patient concurred with the proposed plan, giving informed written consent for the procedure.   Anesthesia and OR aware. Preoperative prophylactic antibiotics and SCDs ordered on call to the OR.  To OR when ready.

## 2022-04-26 NOTE — Op Note (Unsigned)
Doris Sutton, BRANDVOLD MEDICAL RECORD NO: [id number] ACCOUNT NO: [id number] DATE OF BIRTH: 29-Mar-1991 FACILITY: [facility] LOCATION: [facility] PHYSICIAN: Suzy Bouchard, MD  Operative Report   DATE OF PROCEDURE: 04/26/2022  PREOPERATIVE DIAGNOSES: 1.  29+6 weeks estimated gestational age. 2.  Fetal intolerance to labor. 3.  Previously diagnosed partial concealed placental abruption. 4.  Prior parvovirus infection, second trimester.  POSTOPERATIVE DIAGNOSES: 1.  29+6 weeks estimated gestational age. 2.  Fetal intolerance to labor. 3.  Previously diagnosed partial concealed placental abruption. 4.  Prior parvovirus infection, second trimester. 5.  Vigorous female, delivered.  PROCEDURE:  Primary low transverse cesarean section.  ANESTHESIA:  Spinal.  SURGEON:  Suzy Bouchard, MD  FIRST ASSISTANT:  Heloise Ochoa, certified nurse midwife.  INDICATIONS:  31 year old gravida 3, para 1, patient with a prior history of a concealed partial placental abruption documented at 29 weeks estimated gestational age and a prior history of parvovirus in the second trimester.  The patient was laboring  with contractions and showed repetitive late decelerations and decreased variability.  DESCRIPTION OF PROCEDURE:  After adequate spinal anesthesia, the patient was placed in dorsal supine position, hip roll on the right side.  The patient's abdomen was prepped and draped in normal sterile fashion.  Vagina was prepped with Betadine as well.   The patient received 2 grams of IV Ancef prior to commencement of the case.  Timeout was performed.  A Pfannenstiel incision was made 2 fingerbreadths above the symphysis pubis.  Sharp dissection was used to identify the fascia.  Fascia was opened in  the midline and opened in a transverse fashion.  Superior aspect of the fascia was grasped with Kocher clamps and the recti muscles were dissected free.  Inferior aspect of the fascia was grasped with Kocher  clamps and pyramidalis muscle was dissected  free.  Entry into the peritoneal cavity was accomplished sharply.  Vesicouterine peritoneal fold was identified and opened and the bladder was reflected inferiorly.  Low transverse uterine incision was made.  Upon entry into the endometrial cavity, clear  fluid resulted.  A Kiwi vacuum was then placed on the fetus's head and with one gentle pull, the head was delivered and the vacuum was removed and shoulder and body were delivered without difficulty.  Vigorous female was then dried and suctioned on the  mother's abdomen for 60 seconds and the cord was doubly clamped and vigorous female was passed to nursery staff who assigned Apgar scores of 9 and 9, fetal weight 8 pounds 6 ounces.  Placenta was manually delivered and the uterus was exteriorized and the  endometrial cavity was wiped clean with laparotomy tape.  Uterine incision was closed with 1 chromic suture in a running locking fashion, good approximation of edges.  Good hemostasis noted.  Fallopian tubes and ovaries appeared normal.  Posterior  cul-de-sac was irrigated and suctioned.  The uterus was then placed back into the abdominal cavity and the pericolic gutters were wiped clean with laparotomy tape.  Uterine incision again appeared hemostatic and Interceed was placed over the uterine  incision in T-shape fashion.  Fascia was then closed with 0 Vicryl suture in a running nonlocking fashion, good approximation of edges.  Fascial edges were injected with a solution of 60 mL 0.5% Marcaine plus 20 mL normal saline.  Approximately 50 mL of  solution used.  Subcutaneous tissues were irrigated and bovied and the skin was reapproximated with Insorb absorbable staples and additional 30 mL of Marcaine solution was injected beneath  the skin.  There were no complications.  The patient tolerated  the procedure well.  QUANTITATIVE BLOOD LOSS:  205 mL  INTRAOPERATIVE FLUIDS:  700 mL  URINE OUTPUT:  200 mL.  The  patient tolerated the procedure well and was taken to recovery room in good condition.   PUS D: 04/26/2022 12:23:59 pm T: 04/26/2022 12:46:00 pm  JOB: [id number]/ [id number]

## 2022-04-26 NOTE — Discharge Summary (Signed)
Obstetrical Discharge Summary  Patient Name: Doris Sutton DOB: [birth year] MRN: [id number]  Date of Admission: 04/26/2022 Date of Delivery:04/26/22 Delivered by: Beverly Gust MD Date of Discharge: 04/28/2022   Primary OB: [facility]  VCB:SWHQPRF'F last menstrual period was 07/08/2021. EDC Estimated Date of Delivery: 04/27/22 Gestational Age at Delivery: [redacted]w[redacted]d   Antepartum complications: concealed partial abruption post fall 35 weeks  + parvo infection  Admitting Diagnosis: IOL for concealed abruption  Secondary Diagnosis: Patient Active Problem List   Diagnosis Date Noted   S/P cesarean section 04/28/2022   Fall 03/28/2022   Elevated BP without diagnosis of hypertension 03/21/2022   Abnormal cervical Papanicolaou smear 11/25/2021   Cyst of right ovary 09/27/2021   Migraine with aura and without status migrainosus, not intractable 09/27/2021   Mood disturbance 04/01/2020    Augmentation: Pitocin Complications: None Intrapartum complications/course: fetal intolerance to labor , early in labor  Date of Delivery: 04/28/2022  Delivered By: Beverly Gust MD Delivery Type: primary cesarean section, low transverse incision Anesthesia: epidural Placenta: spontaneous Laceration:  Episiotomy: none Newborn Data: Live born female  Birth Weight:  #8/6 APGAR: , 9/9  Newborn Delivery   Birth date/time:  Delivery type: C-Section, Low Transverse C-section categorization: Primary        Postpartum Procedures: none  Edinburgh:     04/27/2022    8:30 AM  Inocente Salles Postnatal Depression Scale Screening Tool  I have been able to laugh and see the funny side of things. 0  I have looked forward with enjoyment to things. 0  I have blamed myself unnecessarily when things went wrong. 2  I have been anxious or worried for no good reason. 0  I have felt scared or panicky for no good reason. 0  Things have been getting on top of me. 1  I have been so unhappy that I  have had difficulty sleeping. 0  I have felt sad or miserable. 1  I have been so unhappy that I have been crying. 0  The thought of harming myself has occurred to me. 0  Edinburgh Postnatal Depression Scale Total 4    Post partum course:  Patient had an uncomplicated postpartum course.  By time of discharge on POD#2, her pain was controlled on oral pain medications; she had appropriate lochia and was ambulating, voiding without difficulty, tolerating regular diet and passing flatus.   She was deemed stable for discharge to home.    Discharge Physical Exam:  BP 117/86   Pulse 78   Temp (!) 97.5 F (36.4 C) (Oral)   Resp 18   Ht 5\' 7"  (1.702 m)   Wt 90.7 kg   LMP 07/08/2021   SpO2 98%   Breastfeeding Unknown   BMI 31.32 kg/m   General: NAD CV: RRR Pulm: CTABL, nl effort ABD: s/nd/nt, fundus firm and below the umbilicus Lochia: moderate Incision: c/d/i DVT Evaluation: LE non-ttp, no evidence of DVT on exam.  Hemoglobin  Date Value Ref Range Status  04/27/2022 12.2 12.0 - 15.0 g/dL Final   HCT  Date Value Ref Range Status  04/27/2022 36.7 36.0 - 46.0 % Final     Disposition: stable, discharge to home. Baby Feeding: breastmilk Baby Disposition: home with mom  Rh Immune globulin given: no Rubella vaccine given: no Tdap vaccine given in AP or PP setting: given 02/18/22 Flu vaccine given in AP or PP setting: Given 02/18/22   Contraception: vasectomy  Prenatal Labs:  Blood type/Rh O POS  Antibody screen neg  Rubella Immune  Varicella Immune  RPR NR  HBsAg Neg  HIV NR  GC neg  Chlamydia neg  Genetic screening Negative materniT21 and AFP  1 hour GTT  113  3 hour GTT    GBS  POS     Plan:  Tillie Fantasia was discharged to home in good condition.   Discharge Medications: Allergies as of 04/28/2022       Reactions   Cheese    Aged cheese-GI issues; per patient she cannot have cheese with candida in it   Citric Acid    ABD pain/GI issues         Medication List     TAKE these medications    acetaminophen 500 MG tablet Commonly known as: TYLENOL Take 1 tablet (500 mg total) by mouth every 6 (six) hours as needed.   cyanocobalamin 500 MCG tablet Commonly known as: VITAMIN B12 Take 500 mcg by mouth daily.   fluticasone 50 MCG/ACT nasal spray Commonly known as: FLONASE Place 2 sprays into both nostrils daily.   ibuprofen 600 MG tablet Commonly known as: ADVIL Take 1 tablet (600 mg total) by mouth every 6 (six) hours as needed.   oxyCODONE 5 MG immediate release tablet Commonly known as: Oxy IR/ROXICODONE Take 1 tablet (5 mg total) by mouth every 6 (six) hours as needed for severe pain.   senna-docusate 8.6-50 MG tablet Commonly known as: Senokot-S Take 2 tablets by mouth 2 (two) times daily as needed for mild constipation.   vitamin C 100 MG tablet Take 200 mg by mouth daily.         Follow-up Information     Schermerhorn, Ihor Austin, MD. Schedule an appointment as soon as possible for a visit in 2 week(s).   Specialty: Obstetrics and Gynecology Why: for a post op appointment Contact information: [address] [place] [address] [phone]                 Signed: Cyril Mourning 04/28/2022 8:31 AM

## 2022-04-26 NOTE — Transfer of Care (Signed)
Immediate Anesthesia Transfer of Care Note  Patient: Doris Sutton  Procedure(s) Performed: Procedure(s): CESAREAN SECTION  Patient Location: L & D 2  Anesthesia Type:Spinal  Level of Consciousness: awake, alert  and oriented  Airway & Oxygen Therapy: Patient Spontanous Breathing and Patient connected to face mask oxygen  Post-op Assessment: Report given to RN and Post -op Vital signs reviewed and stable  Post vital signs: Reviewed and stable  Last Vitals:  Vitals:   04/26/22 0825 04/26/22 1221  BP: 131/89 (!) 104/59  Pulse: 98   Resp: 18 20  Temp: 36.7 C     Complications: No apparent anesthesia complications

## 2022-04-26 NOTE — Progress Notes (Signed)
Labor Progress Note  Doris Sutton is a 31 y.o. G3P1011 at [redacted]w[redacted]d by ultrasound admitted for induction of labor due to hx parvo and concealed placental abruption.   Subjective: To bedside to eval FHR tracing.   Objective: BP 131/89 (BP Location: Left Arm)   Pulse 98   Temp 98 F (36.7 C) (Oral)   Resp 18   LMP 07/08/2021  Notable VS details: reviewed  Fetal Assessment: FHT:  FHR: 135 bpm, variability: minimal ,  accelerations:  Abscent,  decelerations:  Present recurrent lates - PItocin DC, improved variability noted with accel 10*10bpm  Category/reactivity:  Category III UC:   regular, every 3-4 minutes; Pitocin at 46mu/min and stopped with recurrent lates SVE:   last exam 2/80/-1, soft/posterior.   Membrane status: intact   Labs: Lab Results  Component Value Date   WBC 9.6 04/26/2022   HGB 14.0 04/26/2022   HCT 41.4 04/26/2022   MCV 86.3 04/26/2022   PLT 255 04/26/2022    Assessment / Plan: Nonreassuring FHR with Positive contraction stress test.  - Dr Feliberto Gottron called and updated - discussed POC with pt - plan to proceed to LTCS for NRFHR remote from delivery.     Randa Ngo, CNM 04/26/2022, 10:36 AM

## 2022-04-26 NOTE — Anesthesia Preprocedure Evaluation (Signed)
Anesthesia Evaluation  Patient identified by MRN, date of birth, ID band Patient awake    Reviewed: Allergy & Precautions, H&P , NPO status , Patient's Chart, lab work & pertinent test results, reviewed documented beta blocker date and time   History of Anesthesia Complications Negative for: history of anesthetic complications  Airway Mallampati: II  TM Distance: >3 FB Neck ROM: full    Dental  (+) Dental Advidsory Given, Caps, Teeth Intact, Missing   Pulmonary neg pulmonary ROS   Pulmonary exam normal breath sounds clear to auscultation       Cardiovascular Exercise Tolerance: Good negative cardio ROS Normal cardiovascular exam Rhythm:regular Rate:Normal     Neuro/Psych  Headaches, neg Seizures  negative psych ROS   GI/Hepatic Neg liver ROS,GERD  ,,  Endo/Other  negative endocrine ROS    Renal/GU negative Renal ROS  negative genitourinary   Musculoskeletal   Abdominal   Peds  Hematology negative hematology ROS (+)   Anesthesia Other Findings Past Medical History: No date: Allergy No date: Headache   Reproductive/Obstetrics (+) Pregnancy                             Anesthesia Physical Anesthesia Plan  ASA: 2  Anesthesia Plan: Spinal   Post-op Pain Management:    Induction:   PONV Risk Score and Plan: 2  Airway Management Planned: Natural Airway and Simple Face Mask  Additional Equipment:   Intra-op Plan:   Post-operative Plan:   Informed Consent: I have reviewed the patients History and Physical, chart, labs and discussed the procedure including the risks, benefits and alternatives for the proposed anesthesia with the patient or authorized representative who has indicated his/her understanding and acceptance.     Dental Advisory Given  Plan Discussed with: Anesthesiologist, CRNA and Surgeon  Anesthesia Plan Comments:        Anesthesia Quick Evaluation

## 2022-04-26 NOTE — Brief Op Note (Signed)
04/26/2022  12:05 PM  PATIENT:  Doris Sutton  31 y.o. female  PRE-OPERATIVE DIAGNOSIS:  Unscheduled Cesarean Section, See Delivery Summary Fetal intolerance to labor   POST-OPERATIVE DIAGNOSIS:  Unscheduled Cesarean Section, See Delivery Summary Same as above  PROCEDURE:  Procedure(s): CESAREAN SECTION LTCS  SURGEON:  Surgeon(s) and Role:    * Filomena Pokorney, Ihor Austin, MD - Primary  PHYSICIAN ASSISTANT: CNM Mcvey  ASSISTANTS: none   ANESTHESIA:   spinal  EBL: QBL : 205cc IOf700 uo 200cc BLOOD ADMINISTERED:none  DRAINS: Urinary Catheter (Foley)   LOCAL MEDICATIONS USED:  MARCAINE     SPECIMEN:  Source of Specimen:  placenta  DISPOSITION OF SPECIMEN:  PATHOLOGY  COUNTS:  YES  TOURNIQUET:  * No tourniquets in log *  DICTATION: .Other Dictation: Dictation Number verbal  PLAN OF CARE: Admit to inpatient   PATIENT DISPOSITION:  PACU - hemodynamically stable.   Delay start of Pharmacological VTE agent (>24hrs) due to surgical blood loss or risk of bleeding: not applicable

## 2022-04-26 NOTE — Progress Notes (Signed)
Chaplain visited with pt after labor. Pt expressed she wanted to work on getting paperwork done for [employer]. Chaplain explained process and offered to drop off paperwork later today for the pt and husband. Chaplain will return later with paperwork. Plan is for Monday morning to call notary and finalize paperwork.

## 2022-04-26 NOTE — Anesthesia Procedure Notes (Addendum)
Spinal  Patient location during procedure: OR Start time: 04/26/2022 11:22 AM End time: 04/26/2022 11:24 AM Reason for block: surgical anesthesia Staffing Performed: anesthesiologist  Anesthesiologist: Martha Clan, MD Performed by: Doreen Salvage, CRNA Authorized by: Martha Clan, MD   Preanesthetic Checklist Completed: patient identified, IV checked, site marked, risks and benefits discussed, surgical consent, monitors and equipment checked, pre-op evaluation and timeout performed Spinal Block Patient position: sitting Prep: ChloraPrep Patient monitoring: heart rate, continuous pulse ox, blood pressure and cardiac monitor Approach: midline Location: L3-4 Injection technique: single-shot Needle Needle type: Whitacre and Introducer  Needle gauge: 24 G Needle length: 9 cm Assessment Sensory level: T10 Events: CSF return Additional Notes Sterile aseptic technique used throughout the procedure.  Negative paresthesia. Negative blood return. Positive free-flowing CSF. Expiration date of kit checked and confirmed. Patient tolerated procedure well, without complications.

## 2022-04-26 NOTE — Lactation Note (Signed)
This note was copied from a baby's chart. Lactation Consultation Note  Patient Name: Boy Aeliana Spates TZGYF'V Date: 04/26/2022 Reason for consult: L&D Initial assessment;1st time breastfeeding;Term Age:30 hours  Maternal Data Does the patient have breastfeeding experience prior to this delivery?: Yes How long did the patient breastfeed?: 6 mths  Feeding Mother's Current Feeding Choice: Breast Milk Baby rooting on mom's chest,  latched easily to right breast in cradle hold and is nursing well with occ stimulation, some swallows noted LATCH Score Latch: Grasps breast easily, tongue down, lips flanged, rhythmical sucking.  Audible Swallowing: A few with stimulation  Type of Nipple: Everted at rest and after stimulation  Comfort (Breast/Nipple): Soft / non-tender  Hold (Positioning): Assistance needed to correctly position infant at breast and maintain latch.  LATCH Score: 8   Lactation Tools Discussed/Used    Interventions Interventions: Breast feeding basics reviewed;Assisted with latch;Skin to skin;Support pillows;Education  Discharge Pump: Personal WIC Program: No  Consult Status Consult Status: Follow-up from L&D    Dyann Kief 04/26/2022, 1:16 PM

## 2022-04-27 LAB — RPR: RPR Ser Ql: NONREACTIVE

## 2022-04-27 LAB — CBC
HCT: 36.7 % (ref 36.0–46.0)
Hemoglobin: 12.2 g/dL (ref 12.0–15.0)
MCH: 29.3 pg (ref 26.0–34.0)
MCHC: 33.2 g/dL (ref 30.0–36.0)
MCV: 88.2 fL (ref 80.0–100.0)
Platelets: 245 10*3/uL (ref 150–400)
RBC: 4.16 MIL/uL (ref 3.87–5.11)
RDW: 13.4 % (ref 11.5–15.5)
WBC: 12.4 10*3/uL — ABNORMAL HIGH (ref 4.0–10.5)
nRBC: 0 % (ref 0.0–0.2)

## 2022-04-27 NOTE — Anesthesia Post-op Follow-up Note (Signed)
  Anesthesia Pain Follow-up Note  Patient: Doris Sutton  Day #: 1  Date of Follow-up: 04/27/2022 Time: 10:37 AM  Last Vitals:  Vitals:   04/27/22 0700 04/27/22 0830  BP:  111/76  Pulse: 61 68  Resp:  20  Temp:  36.5 C  SpO2: 91% 97%    Level of Consciousness: alert  Pain: none   Side Effects:None  Catheter Site Exam:clean, dry, no drainage  Anti-Coag Meds (From admission, onward)    Start     Dose/Rate Route Frequency Ordered Stop   04/27/22 1200  enoxaparin (LOVENOX) injection 40 mg        40 mg Subcutaneous Every 24 hours 04/26/22 1521          Plan: D/C from anesthesia care at surgeon's request  [facility]

## 2022-04-27 NOTE — Progress Notes (Signed)
Post Partum Day 1 Subjective: Doing well, no complaints.  Tolerating regular diet, pain with PO meds, voiding and ambulating without difficulty.  No CP SOB Fever,Chills, N/V or leg pain; denies nipple or breast pain no HA change of vision, RUQ/epigastric pain  Objective: BP 105/73   Pulse 71   Temp 98.2 F (36.8 C) (Oral)   Resp 18   Ht 5\' 7"  (1.702 m)   Wt 90.7 kg   LMP 07/08/2021   SpO2 96%   Breastfeeding Unknown   BMI 31.32 kg/m    Physical Exam:  General: NAD Breasts: soft/nontender CV: RRR Pulm: nl effort, CTABL Abdomen: soft, NT, BS x 4 Incision: Pressure Dsg CDI Lochia: small Uterine Fundus: fundus firm and 2 fb below umbilicus DVT Evaluation: no cords, ttp LEs   Recent Labs    04/26/22 0826 04/27/22 0609  HGB 14.0 12.2  HCT 41.4 36.7  WBC 9.6 12.4*  PLT 255 245    Assessment/Plan: 31 y.o. 37 postpartum day # 1  - Continue routine PP care: plan shower, IV removal, Dsg change to honeycomb today.  - Lactation consult prn - planning spouse vasectomy.  - CBC reviewed, WNL - Immunization status:  all Imms up to date    Disposition: Does not desire Dc home today.     [id number], CNM 04/27/2022  7:54 AM

## 2022-04-27 NOTE — Anesthesia Postprocedure Evaluation (Signed)
Anesthesia Post Note  Patient: Doris Sutton  Procedure(s) Performed: CESAREAN SECTION  Patient location during evaluation: Mother Baby Anesthesia Type: Spinal Level of consciousness: oriented and awake and alert Pain management: pain level controlled Vital Signs Assessment: post-procedure vital signs reviewed and stable Respiratory status: spontaneous breathing and respiratory function stable Cardiovascular status: blood pressure returned to baseline and stable Postop Assessment: no headache, no backache, no apparent nausea or vomiting and able to ambulate Anesthetic complications: no  No notable events documented.   Last Vitals:  Vitals:   04/27/22 0700 04/27/22 0830  BP:  111/76  Pulse: 61 68  Resp:  20  Temp:  36.5 C  SpO2: 91% 97%    Last Pain:  Vitals:   04/27/22 0830  TempSrc: Axillary  PainSc: 2                  Stephanie Coup

## 2022-04-27 NOTE — Progress Notes (Signed)
  Chaplain On-Call responded to [place] Order from Orie Rout, RN, who reported the patient's request for Advance Directives information.  Chaplain met the patient and her husband Marjory Lies, as they are celebrating the birth of baby Barnet Pall.  Chaplain provided the AD documents and education, and encouraged further reading and discussion.  The patient stated her understanding.  Chaplain Pollyann Samples M.Div., [facility]

## 2022-04-28 ENCOUNTER — Ambulatory Visit: Payer: Self-pay

## 2022-04-28 ENCOUNTER — Encounter: Payer: Self-pay | Admitting: Obstetrics and Gynecology

## 2022-04-28 DIAGNOSIS — Z98891 History of uterine scar from previous surgery: Principal | ICD-10-CM

## 2022-04-28 MED ORDER — IBUPROFEN 600 MG PO TABS: 600.0000 mg | ORAL_TABLET | Freq: Four times a day (QID) | ORAL | 0 refills | Status: AC | PRN

## 2022-04-28 MED ORDER — OXYCODONE HCL 5 MG PO TABS: 5.0000 mg | ORAL_TABLET | Freq: Four times a day (QID) | ORAL | 0 refills | Status: DC | PRN

## 2022-04-28 MED ORDER — ACETAMINOPHEN 500 MG PO TABS: 500.0000 mg | ORAL_TABLET | Freq: Four times a day (QID) | ORAL | Status: AC | PRN

## 2022-04-28 MED ORDER — SENNOSIDES-DOCUSATE SODIUM 8.6-50 MG PO TABS: 2.0000 | ORAL_TABLET | Freq: Two times a day (BID) | ORAL | Status: AC | PRN

## 2022-04-28 NOTE — Progress Notes (Signed)
Patient discharged home with family.  Discharge instructions, when to follow up, and prescriptions reviewed with patient.  Patient verbalized understanding. Patient will be escorted out by auxiliary.   

## 2022-04-28 NOTE — Lactation Note (Signed)
This note was copied from a baby's chart. Lactation Consultation Note  Patient Name: Boy Jaelen Gellerman [id number] Date: 04/28/2022 Reason for consult: Follow-up assessment;Term Age:29 hours  Maternal Data This is mom's 2nd baby, C/S vacuum assisted for fetal heart rate indication. Mom is an experienced breastfeeding mom. Today on follow-up mom reports baby is latching and breastfeeding well. Mom with some nipple tenderness , nipples are intact. Has patient been taught Hand Expression?: Yes Does the patient have breastfeeding experience prior to this delivery?: Yes How long did the patient breastfeed?: 6 months  Feeding Mother's Current Feeding Choice: Breast Milk   Interventions Interventions: Breast feeding basics reviewed;Education (lanolin) Provided tips and strategies to improve nipple tenderness.  Discharge Discharge Education: Engorgement and breast care;Warning signs for feeding baby;Outpatient recommendation Pump: Personal  Consult Status Consult Status: Complete Date: 04/28/22 Follow-up type: In-patient  Update provided to care nurse.  Fuller Song 04/28/2022, 12:14 PM

## 2022-04-28 NOTE — Discharge Instructions (Signed)

## 2022-04-29 LAB — SURGICAL PATHOLOGY

## 2022-05-01 ENCOUNTER — Encounter: Payer: Self-pay | Admitting: Obstetrics and Gynecology

## 2022-05-01 ENCOUNTER — Inpatient Hospital Stay
Admission: EM | Admit: 2022-05-01 | Discharge: 2022-05-02 | Disposition: A | Discharge status: Home / Self Care | Payer: BLUE CROSS/BLUE SHIELD | Source: Home / Self Care | Attending: Obstetrics and Gynecology | Admitting: Obstetrics and Gynecology

## 2022-05-01 ENCOUNTER — Other Ambulatory Visit: Payer: Self-pay

## 2022-05-01 DIAGNOSIS — O1495 Unspecified pre-eclampsia, complicating the puerperium: Principal | ICD-10-CM | POA: Diagnosis present

## 2022-05-01 DIAGNOSIS — O1415 Severe pre-eclampsia, complicating the puerperium: Secondary | ICD-10-CM | POA: Diagnosis present

## 2022-05-01 LAB — PROTEIN / CREATININE RATIO, URINE
Creatinine, Urine: 46 mg/dL
Total Protein, Urine: <6 mg/dL

## 2022-05-01 LAB — COMPREHENSIVE METABOLIC PANEL
ALT: 44 U/L (ref 0–44)
AST: 41 U/L (ref 15–41)
Albumin: 3.5 g/dL (ref 3.5–5.0)
Alkaline Phosphatase: 99 U/L (ref 38–126)
Anion gap: 7 (ref 5–15)
BUN: 14 mg/dL (ref 6–20)
CO2: 24 mmol/L (ref 22–32)
Calcium: 9 mg/dL (ref 8.9–10.3)
Chloride: 110 mmol/L (ref 98–111)
Creatinine, Ser: 0.67 mg/dL (ref 0.44–1.00)
GFR, Estimated: >60 mL/min (ref >60)
Glucose, Bld: 120 mg/dL — ABNORMAL HIGH (ref 70–99)
Potassium: 3.7 mmol/L (ref 3.5–5.1)
Sodium: 141 mmol/L (ref 135–145)
Total Bilirubin: 0.4 mg/dL (ref 0.3–1.2)
Total Protein: 6.9 g/dL (ref 6.5–8.1)

## 2022-05-01 LAB — CBC
HCT: 37.3 % (ref 36.0–46.0)
Hemoglobin: 12.1 g/dL (ref 12.0–15.0)
MCH: 29.1 pg (ref 26.0–34.0)
MCHC: 32.4 g/dL (ref 30.0–36.0)
MCV: 89.7 fL (ref 80.0–100.0)
Platelets: 298 10*3/uL (ref 150–400)
RBC: 4.16 MIL/uL (ref 3.87–5.11)
RDW: 13.2 % (ref 11.5–15.5)
WBC: 8.8 10*3/uL (ref 4.0–10.5)
nRBC: 0 % (ref 0.0–0.2)

## 2022-05-01 LAB — MAGNESIUM: Magnesium: 5.2 mg/dL — ABNORMAL HIGH (ref 1.7–2.4)

## 2022-05-01 MED ORDER — CALCIUM GLUCONATE 10 % IV SOLN
INTRAVENOUS | Status: AC
  Filled 2022-05-01: qty 10

## 2022-05-01 MED ORDER — LABETALOL HCL 100 MG PO TABS
200.0000 mg | ORAL_TABLET | Freq: Three times a day (TID) | ORAL | Status: DC
  Administered 2022-05-02: 200 mg via ORAL
  Filled 2022-05-01: qty 2

## 2022-05-01 MED ORDER — LABETALOL HCL 5 MG/ML IV SOLN: 80.0000 mg | INTRAVENOUS | Status: DC | PRN

## 2022-05-01 MED ORDER — LABETALOL HCL 5 MG/ML IV SOLN: 40.0000 mg | INTRAVENOUS | Status: DC | PRN

## 2022-05-01 MED ORDER — LABETALOL HCL 5 MG/ML IV SOLN: 20.0000 mg | INTRAVENOUS | Status: DC | PRN

## 2022-05-01 MED ORDER — PRENATAL MULTIVITAMIN CH
1.0000 | ORAL_TABLET | Freq: Every day | ORAL | Status: DC
  Administered 2022-05-02: 1 via ORAL
  Filled 2022-05-01: qty 1

## 2022-05-01 MED ORDER — NIFEDIPINE ER OSMOTIC RELEASE 30 MG PO TB24: 90.0000 mg | ORAL_TABLET | Freq: Every day | ORAL | Status: DC

## 2022-05-01 MED ORDER — LACTATED RINGERS IV SOLN: INTRAVENOUS | Status: DC

## 2022-05-01 MED ORDER — MAGNESIUM SULFATE 40 GM/1000ML IV SOLN
2.0000 g/h | INTRAVENOUS | Status: DC
  Administered 2022-05-01 – 2022-05-02 (×2): 2 g/h via INTRAVENOUS
  Filled 2022-05-01 (×2): qty 1000

## 2022-05-01 MED ORDER — ACETAMINOPHEN 500 MG PO TABS
1000.0000 mg | ORAL_TABLET | Freq: Four times a day (QID) | ORAL | Status: DC | PRN
  Administered 2022-05-01 – 2022-05-02 (×2): 1000 mg via ORAL
  Filled 2022-05-01 (×2): qty 2

## 2022-05-01 MED ORDER — CALCIUM CARBONATE ANTACID 500 MG PO CHEW: 2.0000 | CHEWABLE_TABLET | ORAL | Status: DC | PRN

## 2022-05-01 MED ORDER — HYDRALAZINE HCL 20 MG/ML IJ SOLN: 10.0000 mg | INTRAMUSCULAR | Status: DC | PRN

## 2022-05-01 MED ORDER — IBUPROFEN 600 MG PO TABS
600.0000 mg | ORAL_TABLET | Freq: Four times a day (QID) | ORAL | Status: DC | PRN
  Administered 2022-05-02 (×2): 600 mg via ORAL
  Filled 2022-05-01 (×2): qty 1

## 2022-05-01 MED ORDER — DOCUSATE SODIUM 100 MG PO CAPS
100.0000 mg | ORAL_CAPSULE | Freq: Every day | ORAL | Status: DC
  Administered 2022-05-01 – 2022-05-02 (×2): 100 mg via ORAL
  Filled 2022-05-01 (×2): qty 1

## 2022-05-01 MED ORDER — MAGNESIUM SULFATE BOLUS VIA INFUSION
4.0000 g | Freq: Once | INTRAVENOUS | Status: AC
  Administered 2022-05-01: 4 g via INTRAVENOUS
  Filled 2022-05-01: qty 1000

## 2022-05-01 MED ORDER — OXYCODONE HCL 5 MG PO TABS: 5.0000 mg | ORAL_TABLET | Freq: Four times a day (QID) | ORAL | Status: DC | PRN

## 2022-05-01 NOTE — H&P (Signed)
History of Present Illness:   Chief Complaint: postpartum headache and high blood pressure  HPI:  Doris Sutton is a 31 y.o. [phone] female at 5 days postpartum who presented with complaints of severe unrelieved headache. She was seen in office yesterday for LE edema and headache and noted to have mild range BP. She was started on Procardia 90mg  XL daily and Labetalol 200mg  TID at that time. Her labs returned with elevated transaminase levels, and Dr instructed pt to come to [facility] for admission.   - She had an uncomplicated primary LTCS for NRFHR on 04/26/22.  Doris Sutton was discharged home in stable condition on 04/28/22 and did not require oral antihypertensives prior to discharge.   - She reports the following symptoms of Headache, edema starting on 04/29/22.  She was noted to have mild range blood pressures that {Desc; did/not:[id number] require treatment, preeclampsia labs were abnormal  LFTS increased at [facility] lab on 04/30/22 .  -  Decision made to admit for postpartum preeclampsia with severe features and magnesium sulfate infusion per Dr [facility].    Headache: Currently mild and dull, this morning and last night was more severe and light sensitive.  Changes in vision: denies RUQ pain: denies  Factors complicating antepartum care:  Concealed placental abruption s/p fall at 35wks Hx Migraines, followed by Neuro, takes PO Magnesium and Riboflavin.  Positive for Parvovirus exposure in 2nd trimester pregnancy  Patient Active Problem List   Diagnosis Date Noted   Preeclampsia in postpartum period 05/01/2022   S/P cesarean section 04/28/2022   Fall 03/28/2022   Elevated BP without diagnosis of hypertension 03/21/2022   Abnormal cervical Papanicolaou smear 11/25/2021   Cyst of right ovary 09/27/2021   Migraine with aura and without status migrainosus, not intractable 09/27/2021   Mood disturbance 04/01/2020     Maternal Medical History:   Past Medical History:  Diagnosis  Date   Allergy    Headache     Past Surgical History:  Procedure Laterality Date   CESAREAN SECTION  04/26/2022   Procedure: CESAREAN SECTION;  Surgeon: 04/03/2020, MD;  Location: [facility] ORS;  Service: Obstetrics;;   OVARIAN CYST REMOVAL Left 03/2015   TONSILLECTOMY      Allergies  Allergen Reactions   Cheese     Aged cheese-GI issues; per patient she cannot have cheese with candida in it   Citric Acid     ABD pain/GI issues    Prior to Admission medications   Medication Sig Start Date End Date Taking? Authorizing Provider  acetaminophen (TYLENOL) 500 MG tablet Take 1 tablet (500 mg total) by mouth every 6 (six) hours as needed. 04/28/22   04/2015, CNM  Ascorbic Acid (VITAMIN C) 100 MG tablet Take 200 mg by mouth daily.    [provider]  cyanocobalamin (VITAMIN B12) 500 MCG tablet Take 500 mcg by mouth daily.    [provider]  fluticasone (FLONASE) 50 MCG/ACT nasal spray Place 2 sprays into both nostrils daily. Patient not taking: Reported on 03/28/2022 12/28/19   03/30/2022, MD  ibuprofen (ADVIL) 600 MG tablet Take 1 tablet (600 mg total) by mouth every 6 (six) hours as needed. 04/28/22   Domenick Gong, CNM  oxyCODONE (OXY IR/ROXICODONE) 5 MG immediate release tablet Take 1 tablet (5 mg total) by mouth every 6 (six) hours as needed for severe pain. 04/28/22   Haroldine Laws, CNM  senna-docusate (SENOKOT-S) 8.6-50 MG tablet Take 2 tablets by mouth 2 (two) times daily as  needed for mild constipation. 04/28/22   Haroldine Laws, CNM  norethindrone-ethinyl estradiol (FEMHRT 1/5) 1-5 MG-MCG TABS tablet Take 1 tablet by mouth daily.  04/22/19  [provider]     Prenatal care site:  [facility]  OB History  Gravida Para Term Preterm AB Living  3 2 2  0 1 2  SAB IAB Ectopic Multiple Live Births  0 0 0 0 1    # Outcome Date GA Lbr Len/2nd Weight Sex Delivery Anes PTL Lv  3 Term 04/26/22 [redacted]w[redacted]d  3810 g M CS-Vac  Spinal  LIV     Name: Doris Sutton     Apgar1: 9  Apgar5: 9  2 Term 2020     Vag-Spont     1 AB              Social History: She  reports that she has never smoked. She has never used smokeless tobacco. She reports that she does not currently use alcohol. She reports that she does not use drugs.  Family History: family history includes Cancer in her paternal grandmother; Heart disease in her maternal grandmother; Hypertension in her mother.   Review of Systems: A full review of systems was performed and negative except as noted in the HPI.     Physical Exam:  Vital Signs: BP 116/78   Pulse 75   Temp 98.6 F (37 C) (Oral)   Resp 18   Ht 5\' 7"  (1.702 m)   Wt 87 kg   LMP 07/08/2021   SpO2 97%   BMI 30.04 kg/m  Physical Exam  General: no acute distress.  HEENT: normocephalic, atraumatic Heart: regular rate & rhythm.  No murmurs/rubs/gallops Lungs: clear to auscultation bilaterally, normal respiratory effort Abdomen: soft, gravid, non-tender; fundus firm, U/3; well approximated Pfannienstiel incision with honeycomb dsg CDI Pelvic: deferred   Extremities: non-tender, symmetric, 1+ edema bilaterally.  DTRs: 3+, no clonus  Neurologic: Alert & oriented x 3.    Results for orders placed or performed during the hospital encounter of 05/01/22 (from the past 24 hour(s))  CBC     Status: None   Collection Time: 05/01/22  4:46 PM  Result Value Ref Range   WBC 8.8 4.0 - 10.5 K/uL   RBC 4.16 3.87 - 5.11 MIL/uL   Hemoglobin 12.1 12.0 - 15.0 g/dL   HCT 05/03/22 05/03/22 - 95.1 %   MCV 89.7 80.0 - 100.0 fL   MCH 29.1 26.0 - 34.0 pg   MCHC 32.4 30.0 - 36.0 g/dL   RDW 88.4 16.6 - 06.3 %   Platelets 298 150 - 400 K/uL   nRBC 0.0 0.0 - 0.2 %    Pertinent Results:  Prenatal Labs:  Blood type/Rh O POS  Antibody screen neg  Rubella Immune  Varicella Immune  RPR NR  HBsAg Neg  HIV NR  GC neg  Chlamydia neg  Genetic screening Negative materniT21 and AFP  1 hour GTT  113  3 hour GTT     GBS  POS    No results found.  Assessment:  Doris Sutton is a 31 y.o. [phone] female with postpartum preeclampsia with severe features.   Plan:  1. Admit to Labor & Delivery; consents reviewed and obtained - Discussed plan of care with Dr 37  2. Routine postpartum care  - Lactation consult PRN breastfeeding  - Continue routine postpartum medications   3. Postpartum Preeclampsia  - Bedrest with bathroom privileges  - TED hose for VTE prophylaxis  -  given 4 gram Magnesium sulfate IV bolus  - 2 gram Magnesium sulfate infusion for maintenance for 12-24 hours  - Continue hypertensive protocol for treatment of severe range blood pressures  - PO antihypertensives ordered per TJS- Procardia 90mg  XL daily and Labetalol 200mg  TID.  - Total IV fluids 132ml/hr  - Repeat labs in 12 hours   , CNM 05/01/22 5:30 PM

## 2022-05-01 NOTE — Progress Notes (Signed)
CNM to bedside after notified that pt had low O2 sats while sleeping. Pt arouses easily, A&O. Pt and spouse report that she frequently snores and has occasional gasping in her sleep.   O2 sats 93% while sleeping, light snoring noted.   After arousing- 95-96% o2 sats,  Vitals:   05/01/22 1521 05/01/22 1536 05/01/22 1621 05/01/22 1700  BP: 129/85 118/70 120/70 116/78   05/01/22 1900 05/01/22 1917 05/01/22 2017 05/01/22 2100  BP: 130/83 130/83 111/88 127/83   05/01/22 2105 05/01/22 2117  BP: 127/83 123/74   Lungs CTAB HRRR  DTR 2-3+. No clonus  Mag infusing at 2gm/hr; LR at 35ml/hr   Intake/Output Summary (Last 24 hours) at 05/01/2022 2201 Last data filed at 05/01/2022 2035 Gross per 24 hour  Intake 1765.67 ml  Output 1850 ml  Net -84.33 ml    Plan:  Mag level now, encouraged increased HOB angle while sleeping.    Randa Ngo, CNM 05/01/2022 10:02 PM

## 2022-05-02 LAB — COMPREHENSIVE METABOLIC PANEL
ALT: 42 U/L (ref 0–44)
AST: 38 U/L (ref 15–41)
Albumin: 3.4 g/dL — ABNORMAL LOW (ref 3.5–5.0)
Alkaline Phosphatase: 94 U/L (ref 38–126)
Anion gap: 7 (ref 5–15)
BUN: 12 mg/dL (ref 6–20)
CO2: 24 mmol/L (ref 22–32)
Calcium: 7.6 mg/dL — ABNORMAL LOW (ref 8.9–10.3)
Chloride: 107 mmol/L (ref 98–111)
Creatinine, Ser: 0.58 mg/dL (ref 0.44–1.00)
GFR, Estimated: >60 mL/min (ref >60)
Glucose, Bld: 108 mg/dL — ABNORMAL HIGH (ref 70–99)
Potassium: 3.7 mmol/L (ref 3.5–5.1)
Sodium: 138 mmol/L (ref 135–145)
Total Bilirubin: 0.6 mg/dL (ref 0.3–1.2)
Total Protein: 6.8 g/dL (ref 6.5–8.1)

## 2022-05-02 LAB — CBC
HCT: 39 % (ref 36.0–46.0)
Hemoglobin: 12.8 g/dL (ref 12.0–15.0)
MCH: 29.1 pg (ref 26.0–34.0)
MCHC: 32.8 g/dL (ref 30.0–36.0)
MCV: 88.6 fL (ref 80.0–100.0)
Platelets: 287 10*3/uL (ref 150–400)
RBC: 4.4 MIL/uL (ref 3.87–5.11)
RDW: 13.3 % (ref 11.5–15.5)
WBC: 8.1 10*3/uL (ref 4.0–10.5)
nRBC: 0 % (ref 0.0–0.2)

## 2022-05-02 LAB — MAGNESIUM: Magnesium: 5.7 mg/dL — ABNORMAL HIGH (ref 1.7–2.4)

## 2022-05-02 MED ORDER — LABETALOL HCL 200 MG PO TABS: 200.0000 mg | ORAL_TABLET | Freq: Three times a day (TID) | ORAL | Status: AC

## 2022-05-02 MED ORDER — PRENATAL MULTIVITAMIN CH: 1.0000 | ORAL_TABLET | Freq: Every day | ORAL | Status: AC

## 2022-05-02 NOTE — Discharge Summary (Signed)
Discharge Summary:  Post Partum Day 6 Subjective: Doing well, no complaints.  Tolerating regular diet, pain with PO meds, voiding and ambulating without difficulty.  No CP SOB Fever,Chills, N/V or leg pain; denies nipple or breast pain. No HA change of vision, RUQ/epigastric pain, states her headache has resolved.  Objective: BP 118/76   Pulse 75   Temp 97.7 F (36.5 C) (Oral)   Resp 17   Ht 5\' 7"  (1.702 m)   Wt 87 kg   LMP 07/08/2021   SpO2 94%   BMI 30.04 kg/m    Physical Exam:  General: NAD Breasts: soft/nontender CV: RRR Pulm: nl effort, CTABL Abdomen: soft, NT, BS x 4 Incision: Dsg CDI/no erythema or drainage Lochia: moderate Uterine Fundus: fundus firm and 3 fb below umbilicus DVT Evaluation: no cords, ttp LEs   Recent Labs    05/01/22 1646 05/02/22 0703  HGB 12.1 12.8  HCT 37.3 39.0  WBC 8.8 8.1  PLT 298 287   Vitals:   05/02/22 0414 05/02/22 0514 05/02/22 0614 05/02/22 0814  BP: 121/79 122/74 111/71 126/78   05/02/22 0900 05/02/22 1004 05/02/22 1012 05/02/22 1058  BP: 131/80 132/81 131/80 117/73   05/02/22 1112 05/02/22 1212 05/02/22 1310 05/02/22 1413  BP: 126/74 121/73 119/70 118/76   I/O last 3 completed shifts: In: 3464.7 [P.O.:1662; I.V.:1802.7] Out: 5100 [Urine:5100] Total I/O In: 2471.2 [P.O.:1350; I.V.:1121.2] Out: 2300 [Urine:2300]   Assessment/Plan: 31 y.o. 37 re-admit here for postpartum pre-eclampsia with severe features  - Magnesium turned off at 1600. Her Bps have remained stable, she is feeling well, denies headaches, changes of vision, or RUQ pain. Voiding appropriate amounts. Requests to go home.  - Spoke with Dr. [id number] and she is stable to be discharged this evening.  - Continue Labetalol 200mg  TID. Do not take Procardia at this time. Continue checking blood pressures twice daily. Notify on-call CNM if BP <100/60 or >140/90, or if experiencing headaches not improving with rest/fluids/Tylenol. - Call Monday morning  for same-day appointment for BP check.   Disposition: Discharge home  , Saturday 05/02/2022 5:18 PM

## 2022-05-02 NOTE — Progress Notes (Signed)
Post Partum Day 6 Subjective: Doing well, no complaints.  Tolerating regular diet, pain with PO meds, voiding and ambulating without difficulty.  No CP SOB Fever,Chills, N/V or leg pain; denies nipple or breast pain, no change of vision, RUQ/epigastric pain. Mild headache present right above left eye. Pain from headache has not worsened or improved over the last 12hrs.  Objective: BP 126/78   Pulse 83   Temp 97.7 F (36.5 C) (Oral)   Resp 17   Ht 5\' 7"  (1.702 m)   Wt 87 kg   LMP 07/08/2021   SpO2 96%   BMI 30.04 kg/m    Physical Exam:  General: NAD Breasts: soft/nontender CV: RRR Pulm: nl effort, CTABL Abdomen: soft, NT, BS x 4 Incision: Dsg CDI/no erythema or drainage Lochia: moderate Uterine Fundus: fundus firm and 3 fb below umbilicus DVT Evaluation: no cords, ttp LEs   Recent Labs    05/01/22 1646 05/02/22 0703  HGB 12.1 12.8  HCT 37.3 39.0  WBC 8.8 8.1  PLT 298 287   I/O last 3 completed shifts: In: 3464.7 [P.O.:1662; I.V.:1802.7] Out: 5100 [Urine:5100] Total I/O In: 274.9 [P.O.:150; I.V.:124.9] Out: 450 [Urine:450]  Vitals:   05/01/22 2017 05/01/22 2100 05/01/22 2105 05/01/22 2117  BP: 111/88 127/83 127/83 123/74   05/01/22 2217 05/01/22 2317 05/02/22 0135 05/02/22 0314  BP: 126/87 118/75 127/82 138/81   05/02/22 0414 05/02/22 0514 05/02/22 0614 05/02/22 0814  BP: 121/79 122/74 111/71 126/78     Assessment/Plan: 31 y.o. 11-14-1967 postpartum day # 6  - Postpartum pre-eclampsia: has been on IV magnesium since 1611. Diuresing well. BP stable. Receiving Tylenol and Ibuprofen for headaches. Discussed with Dr. 1612 and patient will continue her postpartum magnesium for 24 hours. She is ambulating without difficulty so no need for SCDs, is currently wearing TED hose.  - Lactation consult PRN - no postpartum anemia - BP improved with treatment, continue Labetalol 200mg  TID and Procardia 90mg  XL.   Disposition: Does not desire Dc home today.    Feliberto Gottron, CNM 05/02/2022 9:00 AM

## 2022-05-02 NOTE — OB Triage Note (Signed)
Pt discharged home per order.   Pt stable and ambulatory and an After Visit Summary was printed and given to the patient. Discharge education completed with patient/family including follow up instructions, appointments, and medication list. Pt received HTN precautions. Patient able to verbalize understanding, all questions fully answered upon discharge. Patient instructed to return to ED, call 911, or call MD for any changes in condition. Pt discharged home via personal vehicle with support person.

## 2023-04-12 ENCOUNTER — Encounter: Payer: Self-pay | Admitting: Emergency Medicine

## 2023-04-12 ENCOUNTER — Ambulatory Visit
Admission: EM | Admit: 2023-04-12 | Discharge: 2023-04-12 | Disposition: A | Discharge status: Home / Self Care | Payer: BLUE CROSS/BLUE SHIELD | Attending: Physician Assistant | Admitting: Physician Assistant

## 2023-04-12 DIAGNOSIS — J040 Acute laryngitis: Secondary | ICD-10-CM | POA: Diagnosis not present

## 2023-04-12 LAB — GROUP A STREP BY PCR: Group A Strep by PCR: NOT DETECTED

## 2023-04-12 NOTE — ED Triage Notes (Signed)
Patient states that she had a dental implant done a week ago.  Patient states that she is on Amoxicillin.  Patient states that she developed a sore throat on Thursday.  Patient denies fevers.  Patient is breastfeeding.  Patient does not want a covid test at this time.

## 2023-04-12 NOTE — ED Provider Notes (Signed)
[facility]    CSN: [id number] Arrival date & time: 04/12/23  4098      History   Chief Complaint Chief Complaint  Patient presents with   Sore Throat    HPI Doris Sutton is a 32 y.o. female presenting for sore throat and voice hoarseness x 3 to 4 days.  Patient also reports voice hoarseness.  Denies fever, fatigue, body aches, headaches, cough, congestion.  Reports occasionally clearing her throat and noticing a little bit of mucus.  She recently completed course of amoxicillin the day before symptoms began since she had a dental implant surgery a week and a half ago.  Patient denies any sick contacts.  Works in [employer], music.  Has been using throat lozenges.  HPI  Past Medical History:  Diagnosis Date   Allergy    Headache     Patient Active Problem List   Diagnosis Date Noted   Preeclampsia in postpartum period 05/01/2022   S/P cesarean section 04/28/2022   Fall 03/28/2022   Elevated BP without diagnosis of hypertension 03/21/2022   Abnormal cervical Papanicolaou smear 11/25/2021   Cyst of right ovary 09/27/2021   Migraine with aura and without status migrainosus, not intractable 09/27/2021   Mood disturbance 04/01/2020    Past Surgical History:  Procedure Laterality Date   CESAREAN SECTION  04/26/2022   Procedure: CESAREAN SECTION;  Surgeon: Suzy Bouchard, MD;  Location: [facility] ORS;  Service: Obstetrics;;   OVARIAN CYST REMOVAL Left 03/2015   TONSILLECTOMY      OB History     Gravida  3   Para  2   Term  2   Preterm      AB  1   Living  2      SAB      IAB      Ectopic      Multiple  0   Live Births  1            Home Medications    Prior to Admission medications   Medication Sig Start Date End Date Taking? Authorizing Provider  amoxicillin (AMOXIL) 500 MG tablet Take 500 mg by mouth 3 (three) times daily. 04/03/23  Yes [provider]  pyridOXINE (VITAMIN B6) 25 MG tablet Take 1 tablet by mouth 3  (three) times daily. 05/08/22  Yes [provider]  acetaminophen (TYLENOL) 500 MG tablet Take 1 tablet (500 mg total) by mouth every 6 (six) hours as needed. 04/28/22   Haroldine Laws, CNM  Ascorbic Acid (VITAMIN C) 100 MG tablet Take 200 mg by mouth daily.    [provider]  cyanocobalamin (VITAMIN B12) 500 MCG tablet Take 500 mcg by mouth daily.    [provider]  fluticasone (FLONASE) 50 MCG/ACT nasal spray Place 2 sprays into both nostrils daily. Patient not taking: Reported on 03/28/2022 12/28/19   Domenick Gong, MD  ibuprofen (ADVIL) 600 MG tablet Take 1 tablet (600 mg total) by mouth every 6 (six) hours as needed. 04/28/22   Haroldine Laws, CNM  labetalol (NORMODYNE) 200 MG tablet Take 1 tablet (200 mg total) by mouth 3 (three) times daily. 05/02/22   Janyce Llanos, CNM  Prenatal Vit-Fe Fumarate-FA (PRENATAL MULTIVITAMIN) TABS tablet Take 1 tablet by mouth daily at 12 noon. 05/03/22   Janyce Llanos, CNM  senna-docusate (SENOKOT-S) 8.6-50 MG tablet Take 2 tablets by mouth 2 (two) times daily as needed for mild constipation. 04/28/22   Haroldine Laws, CNM  norethindrone-ethinyl estradiol (  FEMHRT 1/5) 1-5 MG-MCG TABS tablet Take 1 tablet by mouth daily.  04/22/19  [provider]    Family History Family History  Problem Relation Age of Onset   Hypertension Mother    Heart disease Maternal Grandmother    Cancer Paternal Grandmother     Social History Social History   Tobacco Use   Smoking status: Never   Smokeless tobacco: Never  Vaping Use   Vaping status: Never Used  Substance Use Topics   Alcohol use: Not Currently    Comment: occasionally   Drug use: Never     Allergies   Cheese and Citric acid   Review of Systems Review of Systems  Constitutional:  Negative for chills, diaphoresis, fatigue and fever.  HENT:  Positive for sore throat and voice change. Negative for congestion, ear pain, rhinorrhea, sinus  pressure and sinus pain.   Respiratory:  Negative for cough and shortness of breath.   Gastrointestinal:  Negative for abdominal pain, nausea and vomiting.  Musculoskeletal:  Negative for arthralgias and myalgias.  Skin:  Negative for rash.  Neurological:  Negative for weakness and headaches.  Hematological:  Negative for adenopathy.     Physical Exam Triage Vital Signs ED Triage Vitals  Encounter Vitals Group     BP      Systolic BP Percentile      Diastolic BP Percentile      Pulse      Resp      Temp      Temp src      SpO2      Weight      Height      Head Circumference      Peak Flow      Pain Score      Pain Loc      Pain Education      Exclude from Growth Chart    No data found.  Updated Vital Signs BP 110/65 (BP Location: Left Arm)   Pulse 64   Temp 98.5 F (36.9 C) (Oral)   Resp 14   Ht 5\' 7"  (1.702 m)   Wt 191 lb 12.8 oz (87 kg)   SpO2 100%   Breastfeeding Yes   BMI 30.04 kg/m      Physical Exam Vitals and nursing note reviewed.  Constitutional:      General: She is not in acute distress.    Appearance: Normal appearance. She is not ill-appearing or toxic-appearing.     Comments: Patient has voice hoarseness.  She is whispering.  HENT:     Head: Normocephalic and atraumatic.     Nose: Nose normal.     Mouth/Throat:     Mouth: Mucous membranes are moist.     Pharynx: Oropharynx is clear. Posterior oropharyngeal erythema present.  Eyes:     General: No scleral icterus.       Right eye: No discharge.        Left eye: No discharge.     Conjunctiva/sclera: Conjunctivae normal.  Cardiovascular:     Rate and Rhythm: Normal rate and regular rhythm.     Heart sounds: Normal heart sounds.  Pulmonary:     Effort: Pulmonary effort is normal. No respiratory distress.     Breath sounds: Normal breath sounds.  Musculoskeletal:     Cervical back: Neck supple.  Skin:    General: Skin is dry.  Neurological:     General: No focal deficit present.      Mental Status:  She is alert. Mental status is at baseline.     Motor: No weakness.     Gait: Gait normal.  Psychiatric:        Mood and Affect: Mood normal.        Behavior: Behavior normal.        Thought Content: Thought content normal.      UC Treatments / Results  Labs (all labs ordered are listed, but only abnormal results are displayed) Labs Reviewed  GROUP A STREP BY PCR    EKG   Radiology No results found.  Procedures Procedures (including critical care time)  Medications Ordered in UC Medications - No data to display  Initial Impression / Assessment and Plan / UC Course  I have reviewed the triage vital signs and the nursing notes.  Pertinent labs & imaging results that were available during my care of the patient were reviewed by me and considered in my medical decision making (see chart for details).   32 year old female presents for sore throat and voice hoarseness for the past couple of days.  Declines COVID test.  PCR strep is negative.  Advised patient symptoms consistent with viral laryngitis/pharyngitis.  Supportive care encouraged with over-the-counter Chloraseptic spray, throat lozenges, rest, fluids, resting voice.  Reviewed return precautions.   Final Clinical Impressions(s) / UC Diagnoses   Final diagnoses:  Acute viral laryngitis     Discharge Instructions      -Your strep was negative. - Increase rest and fluids.  Try to rest your voice.  Drink cool liquids, ibuprofen, Tylenol, throat lozenges, Chloraseptic spray. - If you develop a fever or worsening symptoms seek reevaluation.  If symptoms are not better after 2 weeks seek reevaluation.     ED Prescriptions   None    PDMP not reviewed this encounter.   Shirlee Latch, PA-C 04/12/23 [phone]

## 2023-04-12 NOTE — Discharge Instructions (Signed)
-  Your strep was negative. - Increase rest and fluids.  Try to rest your voice.  Drink cool liquids, ibuprofen, Tylenol, throat lozenges, Chloraseptic spray. - If you develop a fever or worsening symptoms seek reevaluation.  If symptoms are not better after 2 weeks seek reevaluation.

## 2023-04-13 ENCOUNTER — Ambulatory Visit: Payer: BLUE CROSS/BLUE SHIELD

## 2024-04-08 ENCOUNTER — Encounter: Payer: Self-pay | Admitting: Obstetrics and Gynecology

## 2024-04-14 ENCOUNTER — Other Ambulatory Visit: Payer: Self-pay | Admitting: Obstetrics and Gynecology

## 2024-04-14 DIAGNOSIS — N644 Mastodynia: Secondary | ICD-10-CM

## 2024-04-14 DIAGNOSIS — N6452 Nipple discharge: Secondary | ICD-10-CM

## 2024-04-15 ENCOUNTER — Ambulatory Visit
Admission: RE | Admit: 2024-04-15 | Discharge: 2024-04-15 | Disposition: A | Discharge status: Home / Self Care | Source: Ambulatory Visit | Attending: Obstetrics and Gynecology | Admitting: Obstetrics and Gynecology

## 2024-04-15 DIAGNOSIS — N644 Mastodynia: Secondary | ICD-10-CM | POA: Diagnosis present

## 2024-04-15 DIAGNOSIS — N6452 Nipple discharge: Secondary | ICD-10-CM

## 2024-04-18 ENCOUNTER — Other Ambulatory Visit

## 2024-04-18 ENCOUNTER — Encounter

## 2024-06-09 ENCOUNTER — Ambulatory Visit
Admission: EM | Admit: 2024-06-09 | Discharge: 2024-06-09 | Disposition: A | Discharge status: Home / Self Care | Attending: Emergency Medicine | Admitting: Emergency Medicine

## 2024-06-09 DIAGNOSIS — J101 Influenza due to other identified influenza virus with other respiratory manifestations: Secondary | ICD-10-CM | POA: Diagnosis not present

## 2024-06-09 LAB — POCT INFLUENZA A/B
Influenza A, POC: POSITIVE — AB
Influenza B, POC: NEGATIVE

## 2024-06-09 MED ORDER — BENZONATATE 100 MG PO CAPS: 100.0000 mg | ORAL_CAPSULE | Freq: Three times a day (TID) | ORAL | 0 refills | Status: AC

## 2024-06-09 NOTE — ED Provider Notes (Signed)
 " [facility]    CSN: [id number] Arrival date & time: 06/09/24  1036      History   Chief Complaint Chief Complaint  Patient presents with   Generalized Body Aches   Cough    HPI DESIREA MIZRAHI is a 34 y.o. female.   34 year old female, Daizha Anand, presents to urgent care for evaluation of cough congestion chills that started few days ago.  Patient states she was on antibiotics 05/20/2024 for sinus infection recent exposure to flu by family.  Patient does not wish to take Tamiflu.  The history is provided by the patient. No language interpreter was used.  Cough Associated symptoms: chills   Associated symptoms: no fever     Past Medical History:  Diagnosis Date   Allergy    Headache     Patient Active Problem List   Diagnosis Date Noted   Influenza A 06/09/2024   Preeclampsia in postpartum period 05/01/2022   S/P cesarean section 04/28/2022   Fall 03/28/2022   Elevated BP without diagnosis of hypertension 03/21/2022   Abnormal cervical Papanicolaou smear 11/25/2021   Cyst of right ovary 09/27/2021   Migraine with aura and without status migrainosus, not intractable 09/27/2021   Mood disturbance 04/01/2020    Past Surgical History:  Procedure Laterality Date   CESAREAN SECTION  04/26/2022   Procedure: CESAREAN SECTION;  Surgeon: Lovetta Debby PARAS, MD;  Location: [facility] ORS;  Service: Obstetrics;;   OVARIAN CYST REMOVAL Left 03/2015   TONSILLECTOMY      OB History     Gravida  3   Para  2   Term  2   Preterm      AB  1   Living  2      SAB      IAB      Ectopic      Multiple  0   Live Births  1            Home Medications    Prior to Admission medications  Medication Sig Start Date End Date Taking? Authorizing Provider  benzonatate  (TESSALON ) 100 MG capsule Take 1 capsule (100 mg total) by mouth every 8 (eight) hours. 06/09/24  Yes Lashae Wollenberg, Rilla, NP  acetaminophen  (TYLENOL ) 500 MG tablet Take 1 tablet (500 mg  total) by mouth every 6 (six) hours as needed. 04/28/22   Myron Nest, CNM  amoxicillin  (AMOXIL ) 500 MG tablet Take 500 mg by mouth 3 (three) times daily. 04/03/23   [provider]  Ascorbic Acid (VITAMIN C) 100 MG tablet Take 200 mg by mouth daily.    [provider]  cyanocobalamin (VITAMIN B12) 500 MCG tablet Take 500 mcg by mouth daily.    [provider]  fluticasone  (FLONASE ) 50 MCG/ACT nasal spray Place 2 sprays into both nostrils daily. Patient not taking: Reported on 03/28/2022 12/28/19   Mortenson, Ashley, MD  ibuprofen  (ADVIL ) 600 MG tablet Take 1 tablet (600 mg total) by mouth every 6 (six) hours as needed. 04/28/22   Myron Nest, CNM  labetalol  (NORMODYNE ) 200 MG tablet Take 1 tablet (200 mg total) by mouth 3 (three) times daily. 05/02/22   Tanda Edsel Fuller, CNM  Prenatal Vit-Fe Fumarate-FA (PRENATAL MULTIVITAMIN) TABS tablet Take 1 tablet by mouth daily at 12 noon. 05/03/22   Tanda Edsel Fuller, CNM  pyridOXINE (VITAMIN B6) 25 MG tablet Take 1 tablet by mouth 3 (three) times daily. 05/08/22   [provider]  senna-docusate (SENOKOT-S) 8.6-50  MG tablet Take 2 tablets by mouth 2 (two) times daily as needed for mild constipation. 04/28/22   Myron Nest, CNM  norethindrone-ethinyl estradiol (FEMHRT 1/5) 1-5 MG-MCG TABS tablet Take 1 tablet by mouth daily.  04/22/19  [provider]    Family History Family History  Problem Relation Age of Onset   Hypertension Mother    Heart disease Maternal Grandmother    Cancer Paternal Grandmother     Social History Social History[1]   Allergies   Cheese and Citric acid    Review of Systems Review of Systems  Constitutional:  Positive for chills. Negative for fever.  HENT:  Positive for congestion.   Respiratory:  Positive for cough.   All other systems reviewed and are negative.    Physical Exam Triage Vital Signs ED Triage Vitals  Encounter Vitals Group     BP  06/09/24 1120 103/68     Girls Systolic BP Percentile --      Girls Diastolic BP Percentile --      Boys Systolic BP Percentile --      Boys Diastolic BP Percentile --      Pulse Rate 06/09/24 1120 (!) 108     Resp 06/09/24 1120 16     Temp 06/09/24 1120 98.9 F (37.2 C)     Temp Source 06/09/24 1120 Oral     SpO2 06/09/24 1120 97 %     Weight 06/09/24 1119 156 lb 8 oz (71 kg)     Height --      Head Circumference --      Peak Flow --      Pain Score 06/09/24 1120 0     Pain Loc --      Pain Education --      Exclude from Growth Chart --    No data found.  Updated Vital Signs BP 103/68 (BP Location: Right Arm)   Pulse (!) 108   Temp 98.9 F (37.2 C) (Oral)   Resp 16   Wt 156 lb 8 oz (71 kg)   LMP 06/05/2024   SpO2 97%   BMI 24.51 kg/m   Visual Acuity Right Eye Distance:   Left Eye Distance:   Bilateral Distance:    Right Eye Near:   Left Eye Near:    Bilateral Near:     Physical Exam Vitals and nursing note reviewed.  Constitutional:      General: She is not in acute distress.    Appearance: She is well-developed and well-groomed.  HENT:     Head: Normocephalic.     Right Ear: Tympanic membrane is retracted.     Left Ear: Tympanic membrane is retracted.     Nose: Congestion present.     Mouth/Throat:     Lips: Pink.     Mouth: Mucous membranes are moist.     Pharynx: Oropharynx is clear.  Eyes:     General: Lids are normal.     Conjunctiva/sclera: Conjunctivae normal.     Pupils: Pupils are equal, round, and reactive to light.  Neck:     Trachea: No tracheal deviation.  Cardiovascular:     Rate and Rhythm: Normal rate and regular rhythm.     Pulses: Normal pulses.     Heart sounds: Normal heart sounds. No murmur heard. Pulmonary:     Effort: Pulmonary effort is normal.     Breath sounds: Normal breath sounds.  Abdominal:     General: Bowel sounds are normal.  Palpations: Abdomen is soft.     Tenderness: There is no abdominal tenderness.   Musculoskeletal:        General: Normal range of motion.     Cervical back: Normal range of motion.  Lymphadenopathy:     Cervical: No cervical adenopathy.  Skin:    General: Skin is warm and dry.     Findings: No rash.  Neurological:     General: No focal deficit present.     Mental Status: She is alert and oriented to person, place, and time.     GCS: GCS eye subscore is 4. GCS verbal subscore is 5. GCS motor subscore is 6.  Psychiatric:        Speech: Speech normal.        Behavior: Behavior normal. Behavior is cooperative.      UC Treatments / Results  Labs (all labs ordered are listed, but only abnormal results are displayed) Labs Reviewed  POCT INFLUENZA A/B - Abnormal; Notable for the following components:      Result Value   Influenza A, POC Positive (*)    All other components within normal limits    EKG   Radiology No results found.  Procedures Procedures (including critical care time)  Medications Ordered in UC Medications - No data to display  Initial Impression / Assessment and Plan / UC Course  I have reviewed the triage vital signs and the nursing notes.  Pertinent labs & imaging results that were available during my care of the patient were reviewed by me and considered in my medical decision making (see chart for details).     Discussed exam findings and plan of care with patient :you have tested positive for influenza A. You have declined tamiflu.   Alternate tylenol /ibuprofen  as label directed. Take tessalon  for cough.  Push fluids. If you have new or worsening issues or concerns(chest pain,palpitations, shortness of breath, go to Er for further evaluation).  Patient verbalized understanding to this provider.  Work note given.  Ddx: Influenza A, viral illness, allergies, sinusitis Final Clinical Impressions(s) / UC Diagnoses   Final diagnoses:  Influenza A     Discharge Instructions      You have tested positive for influenza A. You  have declined tamiflu.   Alternate tylenol /ibuprofen  as label directed. Take tessalon  for cough.  Push fluids. If you have new or worsening issues or concerns(chest pain,palpitations, shortness of breath, go to Er for further evaluation).     ED Prescriptions     Medication Sig Dispense Auth. Provider   benzonatate  (TESSALON ) 100 MG capsule Take 1 capsule (100 mg total) by mouth every 8 (eight) hours. 21 capsule Massimo Hartland, NP      PDMP not reviewed this encounter.    [1]  Social History Tobacco Use   Smoking status: Never   Smokeless tobacco: Never  Vaping Use   Vaping status: Never Used  Substance Use Topics   Alcohol use: Not Currently    Comment: occasionally   Drug use: Never     Gracin Soohoo, Rilla, NP 06/09/24 1514  "

## 2024-06-09 NOTE — Discharge Instructions (Addendum)
 You have tested positive for influenza A. You have declined tamiflu.   Alternate tylenol /ibuprofen  as label directed. Take tessalon  for cough.  Push fluids. If you have new or worsening issues or concerns(chest pain,palpitations, shortness of breath, go to Er for further evaluation).

## 2024-06-09 NOTE — ED Triage Notes (Signed)
 Pt present cough with congestion and chills, symptoms started a few days ago. Pt states recently giving an antibiotics on 05/20/2024 for possible sinus infection. Pt has been exposed to flu by family members.
